# Patient Record
Sex: Female | Born: 1935 | Race: White | Hispanic: No | Marital: Single | Smoking: Former smoker
Health system: Southern US, Community
[De-identification: ages and names within clinical notes are randomized; demographics above are authoritative.]

## PROBLEM LIST (undated history)

## (undated) DIAGNOSIS — K219 Gastro-esophageal reflux disease without esophagitis: Secondary | ICD-10-CM

## (undated) DIAGNOSIS — N926 Irregular menstruation, unspecified: Secondary | ICD-10-CM

## (undated) DIAGNOSIS — Z923 Personal history of irradiation: Secondary | ICD-10-CM

## (undated) DIAGNOSIS — IMO0002 Reserved for concepts with insufficient information to code with codable children: Secondary | ICD-10-CM

## (undated) DIAGNOSIS — R51 Headache: Secondary | ICD-10-CM

## (undated) DIAGNOSIS — F419 Anxiety disorder, unspecified: Secondary | ICD-10-CM

## (undated) DIAGNOSIS — R519 Headache, unspecified: Secondary | ICD-10-CM

## (undated) DIAGNOSIS — M51369 Other intervertebral disc degeneration, lumbar region without mention of lumbar back pain or lower extremity pain: Secondary | ICD-10-CM

## (undated) DIAGNOSIS — C801 Malignant (primary) neoplasm, unspecified: Secondary | ICD-10-CM

## (undated) DIAGNOSIS — M1990 Unspecified osteoarthritis, unspecified site: Secondary | ICD-10-CM

## (undated) DIAGNOSIS — M5136 Other intervertebral disc degeneration, lumbar region: Secondary | ICD-10-CM

## (undated) DIAGNOSIS — G4730 Sleep apnea, unspecified: Secondary | ICD-10-CM

## (undated) DIAGNOSIS — I10 Essential (primary) hypertension: Secondary | ICD-10-CM

## (undated) DIAGNOSIS — N840 Polyp of corpus uteri: Secondary | ICD-10-CM

## (undated) DIAGNOSIS — I499 Cardiac arrhythmia, unspecified: Secondary | ICD-10-CM

## (undated) HISTORY — DX: Polyp of corpus uteri: N84.0

## (undated) HISTORY — PX: CATARACT EXTRACTION: SUR2

## (undated) HISTORY — DX: Personal history of irradiation: Z92.3

## (undated) HISTORY — DX: Headache: R51

## (undated) HISTORY — DX: Irregular menstruation, unspecified: N92.6

## (undated) HISTORY — DX: Headache, unspecified: R51.9

## (undated) HISTORY — DX: Malignant (primary) neoplasm, unspecified: C80.1

## (undated) HISTORY — PX: COLONOSCOPY: SHX174

## (undated) HISTORY — PX: DILATION AND CURETTAGE OF UTERUS: SHX78

## (undated) HISTORY — DX: Reserved for concepts with insufficient information to code with codable children: IMO0002

---

## 1998-09-27 ENCOUNTER — Other Ambulatory Visit: Admission: RE | Admit: 1998-09-27 | Discharge: 1998-09-27 | Payer: Self-pay | Admitting: Obstetrics and Gynecology

## 1999-11-29 ENCOUNTER — Encounter: Admission: RE | Admit: 1999-11-29 | Discharge: 1999-11-29 | Payer: Self-pay | Admitting: Internal Medicine

## 1999-11-29 ENCOUNTER — Encounter: Payer: Self-pay | Admitting: Internal Medicine

## 1999-12-06 ENCOUNTER — Encounter: Admission: RE | Admit: 1999-12-06 | Discharge: 1999-12-06 | Payer: Self-pay | Admitting: Internal Medicine

## 1999-12-06 ENCOUNTER — Encounter: Payer: Self-pay | Admitting: Internal Medicine

## 1999-12-10 ENCOUNTER — Other Ambulatory Visit: Admission: RE | Admit: 1999-12-10 | Discharge: 1999-12-10 | Payer: Self-pay | Admitting: Obstetrics and Gynecology

## 2000-11-06 ENCOUNTER — Ambulatory Visit (HOSPITAL_COMMUNITY): Admission: RE | Admit: 2000-11-06 | Discharge: 2000-11-06 | Payer: Self-pay | Admitting: *Deleted

## 2000-11-06 ENCOUNTER — Encounter (INDEPENDENT_AMBULATORY_CARE_PROVIDER_SITE_OTHER): Payer: Self-pay | Admitting: *Deleted

## 2001-02-23 ENCOUNTER — Other Ambulatory Visit: Admission: RE | Admit: 2001-02-23 | Discharge: 2001-02-23 | Payer: Self-pay | Admitting: Obstetrics and Gynecology

## 2001-02-25 ENCOUNTER — Encounter: Payer: Self-pay | Admitting: Internal Medicine

## 2001-02-25 ENCOUNTER — Encounter: Admission: RE | Admit: 2001-02-25 | Discharge: 2001-02-25 | Payer: Self-pay | Admitting: Internal Medicine

## 2002-07-26 ENCOUNTER — Encounter: Payer: Self-pay | Admitting: Internal Medicine

## 2002-07-26 ENCOUNTER — Encounter: Admission: RE | Admit: 2002-07-26 | Discharge: 2002-07-26 | Payer: Self-pay | Admitting: Internal Medicine

## 2003-03-02 ENCOUNTER — Encounter (INDEPENDENT_AMBULATORY_CARE_PROVIDER_SITE_OTHER): Payer: Self-pay | Admitting: Specialist

## 2003-03-02 ENCOUNTER — Ambulatory Visit (HOSPITAL_COMMUNITY): Admission: RE | Admit: 2003-03-02 | Discharge: 2003-03-02 | Payer: Self-pay | Admitting: *Deleted

## 2003-11-09 ENCOUNTER — Encounter: Admission: RE | Admit: 2003-11-09 | Discharge: 2003-11-09 | Payer: Self-pay | Admitting: Internal Medicine

## 2005-01-09 ENCOUNTER — Encounter: Admission: RE | Admit: 2005-01-09 | Discharge: 2005-01-09 | Payer: Self-pay | Admitting: Internal Medicine

## 2005-10-11 ENCOUNTER — Encounter (INDEPENDENT_AMBULATORY_CARE_PROVIDER_SITE_OTHER): Payer: Self-pay | Admitting: Specialist

## 2005-10-11 ENCOUNTER — Ambulatory Visit (HOSPITAL_COMMUNITY): Admission: RE | Admit: 2005-10-11 | Discharge: 2005-10-11 | Payer: Self-pay | Admitting: *Deleted

## 2006-02-05 ENCOUNTER — Encounter: Admission: RE | Admit: 2006-02-05 | Discharge: 2006-02-05 | Payer: Self-pay | Admitting: Internal Medicine

## 2006-11-13 ENCOUNTER — Ambulatory Visit (HOSPITAL_COMMUNITY): Admission: RE | Admit: 2006-11-13 | Discharge: 2006-11-13 | Payer: Self-pay | Admitting: *Deleted

## 2006-11-13 ENCOUNTER — Encounter (INDEPENDENT_AMBULATORY_CARE_PROVIDER_SITE_OTHER): Payer: Self-pay | Admitting: *Deleted

## 2006-11-26 ENCOUNTER — Inpatient Hospital Stay (HOSPITAL_COMMUNITY): Admission: EM | Admit: 2006-11-26 | Discharge: 2006-11-28 | Payer: Self-pay | Admitting: Emergency Medicine

## 2007-01-15 ENCOUNTER — Encounter: Admission: RE | Admit: 2007-01-15 | Discharge: 2007-01-15 | Payer: Self-pay | Admitting: Internal Medicine

## 2007-03-03 ENCOUNTER — Encounter: Admission: RE | Admit: 2007-03-03 | Discharge: 2007-03-03 | Payer: Self-pay | Admitting: Internal Medicine

## 2008-03-09 ENCOUNTER — Encounter: Admission: RE | Admit: 2008-03-09 | Discharge: 2008-03-09 | Payer: Self-pay | Admitting: Internal Medicine

## 2009-07-24 ENCOUNTER — Encounter: Admission: RE | Admit: 2009-07-24 | Discharge: 2009-07-24 | Payer: Self-pay | Admitting: Internal Medicine

## 2010-09-04 NOTE — Discharge Summary (Signed)
Heidi Fleming, Heidi Fleming                 ACCOUNT NO.:  [id number]   MEDICAL RECORD NO.:  [id number]          PATIENT TYPE:  INP   LOCATION:  4702                         FACILITY:  [facility]   PHYSICIAN:  Michaelyn Barter, M.D. DATE OF BIRTH:  1931/03/06   DATE OF ADMISSION:  11/26/2006  DATE OF DISCHARGE:  11/28/2006                               DISCHARGE SUMMARY   FINAL DIAGNOSIS:  1. Gastrointestinal bleed.  2. Hypokalemia.  3. Hypertension   CONSULTATIONS:  Gastroenterology with Dr. [name].   HISTORY OF PRESENT ILLNESS:  [name] is a 77 year old female who  underwent a colonoscopy on November 13, 2006, with Dr. [name].  She indicated  that on the date of this current admission at approximately 4 o'clock  p.m., she had the urge to have a bowel movement.  While defecating, she  felt a gust from her rectum.  She looked into the toilet, and the water  appeared to be filled with blood.  She came to the hospital ER for  further evaluation. While in the ER, she had at least three additional  bloody bowel movements.  She denied having any pain.  For Past Medical  History please see that dictated by Dr. Elliot Cousin   HOSPITAL COURSE:  #1.  GASTROINTESTINAL BLEED:  The source of this was questionable.  Gastroenterology was consulted. Dr. [name] saw the patient on November 27, 2006. His impression was that the patient did have a GI bleed with an  unknown etiology but it seems to have been slowing. The patient is  currently 2 weeks out from colonoscopy; however, her symptoms could  still be related to that. He indicated that observation should take  place for now. Over the following 24 hours, the patient has indicated  that her rectal bleeding has decreased significantly.  She has remained  hemodynamically stable throughout her hospitalization, and she has  denied having any abdominal pain throughout this hospitalization.   #2.  HYPOKALEMIA:  The patient was noted to have had a potassium level  of 3.3 at  the time of admission.  This was supplemented.   #3.  HISTORY OF HYPERTENSION:  This remained stable.   On the date of discharge, the patient indicated that she felt good.  She  denied having any abdominal pain, and she stated that the rectal  bleeding had decreased.  Vital Signs:  Her temperature was 97.6, heart  rate 71, respirations 18, blood pressure 129/68, O2 saturation 97% on 2  liters.  She looks relatively comfortable.  Her sodium level was 137,  potassium 3.7, chloride 104, CO2 27, BUN 10, creatinine 0.95, glucose  79, calcium 8.5. White blood cell count of 8.6, hemoglobin 11.5,  hematocrit 33.4, platelets 235.  The patient indicated that she wanted  to be discharged home today.  Therefore, the decision was made to  discharge the patient from the hospital.  The patient will be discharged  home on her previous home medications.  She indicated that she has an  appointment to see Dr. [name] within the next couple of weeks already  scheduled.  She will be instructed to keep that appointment and to call  either Dr. [name] or to return to the hospital if further bleeding from her  rectum continues      Michaelyn Barter, M.D.  Electronically Signed     OR/MEDQ  D:  11/28/2006  T:  11/28/2006  Job:  [id number]

## 2010-09-04 NOTE — H&P (Signed)
Heidi Fleming, Heidi Fleming                 ACCOUNT NO.:  [id number]   MEDICAL RECORD NO.:  [id number]          PATIENT TYPE:  INP   LOCATION:  1827                         FACILITY:  [facility]   PHYSICIAN:  Elliot Cousin, M.D.    DATE OF BIRTH:  08-16-1930   DATE OF ADMISSION:  11/26/2006  DATE OF DISCHARGE:                              HISTORY & PHYSICAL   PRIMARY CARE PHYSICIAN:  [name], M.D.   PRIMARY GASTROENTEROLOGIST:  [name], M.D.   CHIEF COMPLAINT:  Rectal bleeding.   HISTORY OF PRESENT ILLNESS:  The patient is a 77 year old woman with a  past medical history significant for colon polyps and hypertension, who  presents to the emergency department with a chief complaint of rectal  bleeding.  The patient recently underwent a colonoscopy on November 13, 2006  by Dr. [name].  She was found to have 2 polyps in the ascending colon which  were biopsied.  The pathology report revealed hyperplastic polyps.  Since that time, the patient has had no abdominal pain or rectal  bleeding until this afternoon.  At approximately 4 to 4:30 p.m., the  patient felt as if she needed to have a bowel movement.  She proceeded  to the bathroom and felt a gush of what was thought to be stool come  from her rectum.  When she looked into the toilet, she saw that the bowl  was filled with blood.  She became alarmed and decided to come to the  emergency department.  When she arrived to the emergency department at  approximately 5:30 to 6 o'clock p.m., she had another very large bloody  bowel movement.  Throughout the stay in the emergency department, she  had an additional three more medium sized bloody bowel movements.  Most  of the bowel movements have been maroon in color.  There has been very  little stool with each bowel movement.  She did have a mild episode of  cramping with one bowel movement, otherwise she has had no abdominal  pain.  She has had no nausea, vomiting or hematemesis.  She does  complain  of generalized weakness.  The patient denies NSAID use.  She  does not drink alcohol.   During the evaluation in the emergency department, the patient is noted  to be hypertensive with a blood pressure of 161/83.  She was initially  tachycardic on arrival to the emergency department with a heart rate of  119 beats per minute.  Her hemoglobin is currently 14.1.  The patient  will be admitted for further evaluation and management.   PAST MEDICAL HISTORY:  1. History of multiple colon polyps.  2. Colonoscopy with biopsy October 14, 2006 per Dr. [name].  The results      revealed 2 polyps of the ascending colon, hyperplastic.  3. Colonoscopy in June 2007 with biopsy and status post polypectomy.      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  4. Hypertension.  5. Gastroesophageal reflux disease.  6. Hiatal hernia per EGD  in July 2002.  7. Chronic insomnia.  8. Status post bilateral cataract surgery in the past.   MEDICATIONS:  1. Alprazolam 0.5 mg q.h.s.  2. Multivitamin once daily.  3. Quinapril 10 mg daily.  4. Hydrochlorothiazide 25 mg one-half tablet daily.  5. Omeprazole 20 mg daily.   ALLERGIES:  There are no known drug allergies, although she is  intolerant to [facility].   SOCIAL HISTORY:  The patient is single.  She has no children.  She is  retired.  She denies tobacco, alcohol and illicit drug use.   FAMILY HISTORY:  The patient's mother died of a stroke at 61 years of  age.  Her father died of aspiration pneumonia at 44 years of age.  He  also had a history of diabetes mellitus.   REVIEW OF SYSTEMS:  Otherwise negative.   PHYSICAL EXAMINATION:  VITAL SIGNS:  Temperature is 97.3, blood pressure  161/83, pulse 89, respiratory rate 23, oxygen saturation 99% on room  air.  GENERAL:  The patient is a pleasant obese, 77 year old Caucasian woman  who is currently lying in bed in no acute distress.  HEENT:  Head is normocephalic,  atraumatic.  Pupils are equal, round,  reactive to light.  Extraocular movements are intact.  Conjunctivae are  clear.  Sclerae are white.  Nasal mucosa is mildly dry.  No sinus  tenderness.  Oropharynx reveals fair dentition.  Mucous membranes are  moist.  No posterior exudates or erythema.  NECK:  Supple.  No adenopathy, no thyromegaly, no bruit, no JVD.  LUNGS:  Clear to auscultation bilaterally.  HEART:  S1 and S2 with borderline tachycardia.  No rubs or murmurs.  ABDOMEN:  Obese, positive bowel sounds, soft, nontender, nondistended.  No hepatosplenomegaly.  No mass is palpated.  RECTAL/GU:  Deferred.  EXTREMITIES:  Pedal pulses are palpable bilaterally.  No pretibial edema  and no pedal edema.  NEUROLOGIC:  The patient is alert, oriented times 3.  Cranial nerves II  through XII are intact.  Strength is 5/5 throughout.  Sensation is  intact.   ADMISSION LABORATORIES:  EKG is pending.  White blood count 8.6,  hemoglobin 14.1, platelets 235.  PT 13.2, INR 1, PTT 32.  Sodium 134,  potassium 3.3, chloride 98, CO2 26, glucose 116, BUN 13, creatinine  0.96, calcium 8.8, total protein 6.7, albumin 3.5, AST 22, ALT 22.   ASSESSMENT:  1. Hematochezia / lower gastrointestinal bleed.  The patient recently      underwent a colonoscopy which revealed 2 polyps.  It is unclear      whether or not the gastrointestinal bleeding is a consequence of      the recent polypectomy.  There was apparently no other lesions per      the colonoscopy report by Dr. [name].  Her hemoglobin is currently      stable.  Her blood pressure is actually moderately hypertensive.      She does have borderline tachycardia, however.  2. Hypokalemia.  The patient's serum potassium is 3.3.  More than      likely, the hypokalemia is secondary to hydrochlorothiazide      therapy.  3. Hypertension.   PLAN:  1. The patient will be admitted for further evaluation and management.  2. Will type and screen 2 units of packed red  blood cells and hold.  3. Gentle IV fluids.  4. Sips of clear liquids for now.  5. Will monitor her hemoglobin and hematocrit every 4 hours times  24      hours and daily thereafter.  6. IV Protonix.  7. Blood pressure control.  8. Replete potassium chloride and check a magnesium level to rule out      deficiency.  9. Will consult Dr. [name].      Elliot Cousin, M.D.  Electronically Signed     DF/MEDQ  D:  11/26/2006  T:  11/26/2006  Job:  [id number]   cc:   [name], MD  [name], M.D.

## 2010-09-04 NOTE — Op Note (Signed)
Heidi Fleming, Heidi Fleming                 ACCOUNT NO.:  [id number]   MEDICAL RECORD NO.:  [id number]          PATIENT TYPE:  AMB   LOCATION:  ENDO                         FACILITY:  [facility]   PHYSICIAN:  [name], M.D.    DATE OF BIRTH:  November 25, 1930   DATE OF PROCEDURE:  11/13/2006  DATE OF DISCHARGE:                               OPERATIVE REPORT   PROCEDURE:  Colonoscopy with biopsy.   INDICATIONS:  Colon polyps.   ANESTHESIA:  Demerol 100 mg, Versed 7 mg.   PROCEDURE:  With the patient mildly sedated in the left lateral  decubitus position the Pentax videoscopic colonoscope was inserted in  the rectum and passed under direct vision to the cecum identified by  ileocecal valve and appendiceal orifice both which were photographed.  From this point the colonoscope was slowly withdrawn taking  circumferential views of colonic mucosa stopping in the ascending colon  where two polyps were seen, photographed and removed both using hot  biopsy forceps technique setting of 20/150 blended current.  We then  withdrew the colonoscope taking circumferential views of remaining  colonic mucosa stopping only in the rectum which appeared normal on  direct and retroflexed view.  The endoscope was straightened and  withdrawn.  The patient's vital signs, pulse oximeter remained stable.  The patient tolerated procedure well without apparent complications.   FINDINGS:  Two polyps of the ascending colon.  Await biopsy report.  The  patient will call me for results and follow-up with me as an outpatient  as needed.           ______________________________  [name], M.D.     GMO/MEDQ  D:  11/13/2006  T:  11/13/2006  Job:  [id number]

## 2010-09-07 NOTE — Procedures (Signed)
[facility]  Patient:    Heidi Fleming, Heidi Fleming                        MRN: [id number] Proc. Date: 11/06/00 Adm. Date:  [id number] Attending:  Sabino Gasser                           Procedure Report  PROCEDURE:  Upper endoscopy.  INDICATION FOR PROCEDURE:  Significant reflux symptoms.  ANESTHESIA:  Demerol 50, Versed 5 mg.  DESCRIPTION OF PROCEDURE:  With the patient mildly sedated in the left lateral decubitus position, the Olympus videoscopic endoscope was inserted in the mouth and passed under direct vision through the esophagus which appeared grossly normal. We entered into the stomach through a small hiatal hernia. The fundus, body, antrum, duodenal bulb and second portion of the duodenum all appeared normal. From this point, the endoscope was slowly withdrawn taking circumferential views of the entire duodenal mucosa until the endoscope was then pulled back into the stomach, placed in retroflexion to view the stomach from below and a hiatal hernia was once again seen. The endoscope was then straightened and withdrawn taking circumferential views of the remaining gastric and esophageal mucosa. No evidence of Barretts was seen. The patients vital signs and pulse oximeter remained stable. The patient tolerated the procedure well without apparent complications.  FINDINGS:  Small hiatal hernia otherwise unremarkable examination.  PLAN:  Proceed to colonoscopy as planned. DD:  11/06/00 TD:  11/06/00 Job: [id number]

## 2010-09-07 NOTE — Procedures (Signed)
[facility]  Patient:    Heidi Fleming, Heidi Fleming                        MRN: [id number] Proc. Date: 11/06/00 Adm. Date:  [id number] Attending:  Sabino Gasser                           Procedure Report  PROCEDURE:  Colonoscopy.  INDICATION FOR PROCEDURE:  Colon polyps seen on flexible sigmoidoscopy.  ANESTHESIA:  Demerol 50, Versed 5 mg additional.  DESCRIPTION OF PROCEDURE:  With the patient mildly sedated in the left lateral decubitus position, the Olympus videoscopic colonoscope was inserted in the rectum and passed under direct vision to the cecum identified by the ileocecal valve and appendiceal orifice both of which were photographed. One fold removed from the ileocecal valve was an approximately 1 cm in length polyp. It was folded over that fold. This was photographed and first biopsies were taken and then subsequent ERBE argon photocoagulator probe was attached and advanced through the endoscope and this area was eradicated. There were numerous other small polyps in this area of the cecum and they were removed either by hot biopsy forceps technique or with eradication by ERBE argon photocoagulator. Once this was accomplished, the endoscope was then slowly withdrawn taking circumferential views of the entire colonic mucosa stopping next in the hepatic flexure area where another polyp was seen. It too was photographed and it was removed using hot biopsy forceps technique. There was some residual tissue that was also eradicated using argon photocoagulator. The endoscope was then withdrawn all the way to the descending/sigmoid colon where two polyps adjacent to each other were seen. These may have been the index polyp and they were both removed using snare cautery technique on a setting of 20/20 blended current which is what we used with the hot biopsy forceps procedures described above. This tissue was retrieved, the endoscope was withdrawn all the way to the  rectum which appeared normal on direct and retroflexed view. The endoscope was then straightened and withdrawn. The patients vital signs and pulse oximeter remained stable. The patient tolerated the procedure well without apparent complications.  FINDINGS:  Multiple polyps of cecum, polyps to hepatic flexure, polyps of the descending/sigmoid colon area.  PLAN:  Await pathology report. The patient will call me for results and follow-up with me as an outpatient. Will avoid aspirin and place the patient on a low residue diet for the next two weeks. DD:  11/06/00 TD:  11/06/00 Job: [id number]

## 2010-09-07 NOTE — Op Note (Signed)
   NAME:  Heidi Fleming, Heidi Fleming                           ACCOUNT NO.:  [id number]   MEDICAL RECORD NO.:  [id number]                   PATIENT TYPE:  AMB   LOCATION:  ENDO                                 FACILITY:  [facility]   PHYSICIAN:  [name], M.D.                 DATE OF BIRTH:  1930/08/28   DATE OF PROCEDURE:  DATE OF DISCHARGE:                                 OPERATIVE REPORT   PROCEDURE:  Colonoscopy with polypectomy and biopsy.   INDICATIONS FOR PROCEDURE:  Colon polyps.   ANESTHESIA:  Demerol 80, Versed 8 mg.   DESCRIPTION OF PROCEDURE:  With the patient mildly sedated in the left  lateral decubitus position, the Olympus videoscopic colonoscope was inserted  in the rectum and passed under direct vision to the cecum identified by the  ileocecal valve and appendiceal orifice both of which were photographed.  From this point, the colonoscope was slowly withdrawn taking circumferential  views of the colonic mucosa as we withdrew all the way to the rectum. We  stopped only first in the ascending colon just a few folds removed from the  ileocecal valve where a polyp was seen, photographed and removed using hot  biopsy forceps technique. We stopped next in the descending colon where  another polyp was seen, photographed and removed using hot forceps technique  with again the same setting. We stopped to photograph diverticula seen in  the sigmoid colon and then also in the sigmoid colon a polyp was seen,  photographed and removed using snare cautery technique. All of the tissue  was retrieved for pathology. The endoscope was pulled back to the rectum  which appeared normal on direct and showed large hemorrhoids on retroflexed  view and pulled through the anal canal. The patient's vital signs and pulse  oximeter remained stable. The patient tolerated the procedure well without  apparent complications.   FINDINGS:  Polyps of ascending, descending and sigmoid colon, occasional  diverticulum seen in the sigmoid colon and large internal hemorrhoids.   PLAN:  Await biopsy report. The patient will call me for results and  followup with me as an outpatient.                                               [name], M.D.    GMO/MEDQ  D:  03/02/2003  T:  03/02/2003  Job:  [id number]

## 2010-09-07 NOTE — Op Note (Signed)
NAMEKENNEDY, Heidi Fleming                 ACCOUNT NO.:  [id number]   MEDICAL RECORD NO.:  [id number]          PATIENT TYPE:  AMB   LOCATION:  ENDO                         FACILITY:  [facility]   PHYSICIAN:  [name], M.D.    DATE OF BIRTH:  Aug 19, 1930   DATE OF PROCEDURE:  10/11/2005  DATE OF DISCHARGE:                                 OPERATIVE REPORT   PROCEDURE:  Colonoscopy with polypectomy and biopsy.   INDICATIONS:  Colon polyps.   ANESTHESIA:  Demerol 100 and Versed 10 mg.   PROCEDURE:  With the patient mildly sedated in the left lateral decubitus  position, the Olympus videoscopic colonoscope was inserted in the rectum and  passed under direct vision.  With pressure applied, we reached the cecum,  identified by the ileocecal valve and appendiceal orifice both of which were  photographed.  From this point, the colonoscope was slowly withdrawn taking  circumferential views of colonic mucosa stopping in the ascending colon near  the hepatic flexure where we encountered three polyps.  First was bean  shaped on a stalk which we removed using snare cautery technique setting of  25/200 blended current.  There was a polyp adjacent to it on the wall of the  colon which we removed using hot biopsy forceps technique and eradicated the  rest of the wall using the hot biopsy forceps tip, again with a setting of  25/200 blended current.  There was a third polyp that we removed again using  hot biopsy forceps technique and this was photographed and removed using the  same setting.  All of the tissue was retrieved for pathology. The original  polyp had to be removed by suctioning it into a [employer] and we had to  remove the colonoscope was then reinsert back to this level.  The endoscope  was then withdrawn taking circumferential views of the remaining colonic  mucosa stopping in the descending colon where a polyp was seen and removed  using snare cautery technique with the same setting and a  third polyp was  seen at 25 cm from the anal verge.  It was photographed and it too was  removed using snare cautery technique with the same setting.  All of these  polyps were retrieved for pathology.  The endoscope was withdrawn then all  the way to the rectum, taking circumferential views of the remaining colonic  mucosa stopping in the rectum which appeared normal on direct and  retroflexed view.  The endoscope was straightened and withdrawn.  The  patient's vital signs and pulse oximeter remained stable.  The patient  tolerated procedure well without apparent complication.   FINDINGS:  Multiple polyps in the ascending colon, descending colon and at  25 cm from anal verge.  Await biopsy report.  The patient will call me for  results and follow-up with me as an outpatient.           ______________________________  [name], M.D.     GMO/MEDQ  D:  10/11/2005  T:  10/11/2005  Job:  [id number]

## 2011-02-04 LAB — BASIC METABOLIC PANEL
BUN: 11
CO2: 27
CO2: 28
Calcium: 8.2 — ABNORMAL LOW
Calcium: 8.5
Chloride: 103
Chloride: 104
Creatinine, Ser: 0.87
Creatinine, Ser: 0.95
GFR calc Af Amer: >60
GFR calc Af Amer: >60
GFR calc non Af Amer: 58 — ABNORMAL LOW
GFR calc non Af Amer: >60
Glucose, Bld: 110 — ABNORMAL HIGH
Glucose, Bld: 79
Potassium: 3.7
Potassium: 3.9
Sodium: 137
Sodium: 139

## 2011-02-04 LAB — CROSSMATCH
ABO/RH(D): O POS
Antibody Screen: NEGATIVE

## 2011-02-04 LAB — COMPREHENSIVE METABOLIC PANEL
ALT: 22
AST: 22
Albumin: 3.5
Alkaline Phosphatase: 82
BUN: 13
CO2: 26
Calcium: 8.8
Chloride: 98
Creatinine, Ser: 0.96
GFR calc Af Amer: >60
GFR calc non Af Amer: 57 — ABNORMAL LOW
Glucose, Bld: 116 — ABNORMAL HIGH
Potassium: 3.3 — ABNORMAL LOW
Sodium: 134 — ABNORMAL LOW
Total Bilirubin: 0.9
Total Protein: 6.7

## 2011-02-04 LAB — CBC
HCT: 33.4 — ABNORMAL LOW
HCT: 40.2
Hemoglobin: 11.5 — ABNORMAL LOW
MCHC: 34.6
MCV: 87.8
Platelets: 235
RBC: 3.8 — ABNORMAL LOW
RBC: 4.67
RDW: 14.4 — ABNORMAL HIGH
WBC: 8.6

## 2011-02-04 LAB — HEMOGLOBIN AND HEMATOCRIT, BLOOD
HCT: 30.9 — ABNORMAL LOW
HCT: 31 — ABNORMAL LOW
HCT: 34.5 — ABNORMAL LOW
HCT: 34.6 — ABNORMAL LOW
HCT: 35.8 — ABNORMAL LOW
HCT: 36.2
Hemoglobin: 10.5 — ABNORMAL LOW
Hemoglobin: 10.6 — ABNORMAL LOW
Hemoglobin: 11.7 — ABNORMAL LOW
Hemoglobin: 11.8 — ABNORMAL LOW
Hemoglobin: 12.5
Hemoglobin: 12.6

## 2011-02-04 LAB — DIFFERENTIAL
Basophils Relative: 0
Eosinophils Absolute: 0.2
Eosinophils Relative: 2
Lymphocytes Relative: 22
Lymphs Abs: 1.9
Monocytes Relative: 8

## 2011-02-04 LAB — APTT: aPTT: 32

## 2011-02-04 LAB — ABO/RH: ABO/RH(D): O POS

## 2011-02-04 LAB — PROTIME-INR: Prothrombin Time: 13.2

## 2011-08-28 ENCOUNTER — Ambulatory Visit (INDEPENDENT_AMBULATORY_CARE_PROVIDER_SITE_OTHER): Payer: Medicare Other | Admitting: Ophthalmology

## 2011-08-28 DIAGNOSIS — I10 Essential (primary) hypertension: Secondary | ICD-10-CM

## 2011-08-28 DIAGNOSIS — H43819 Vitreous degeneration, unspecified eye: Secondary | ICD-10-CM

## 2011-08-28 DIAGNOSIS — H35379 Puckering of macula, unspecified eye: Secondary | ICD-10-CM

## 2011-08-28 DIAGNOSIS — H33309 Unspecified retinal break, unspecified eye: Secondary | ICD-10-CM

## 2011-08-28 DIAGNOSIS — H2700 Aphakia, unspecified eye: Secondary | ICD-10-CM

## 2011-08-28 DIAGNOSIS — H35039 Hypertensive retinopathy, unspecified eye: Secondary | ICD-10-CM

## 2012-02-15 ENCOUNTER — Encounter (HOSPITAL_COMMUNITY): Payer: Self-pay | Admitting: Pharmacist

## 2012-02-20 ENCOUNTER — Encounter (HOSPITAL_COMMUNITY): Payer: Self-pay | Admitting: *Deleted

## 2012-02-20 ENCOUNTER — Other Ambulatory Visit: Payer: Self-pay | Admitting: Obstetrics & Gynecology

## 2012-02-25 ENCOUNTER — Encounter (HOSPITAL_COMMUNITY)
Admission: RE | Admit: 2012-02-25 | Discharge: 2012-02-25 | Disposition: A | Discharge status: Home / Self Care | Payer: Medicare Other | Source: Ambulatory Visit | Attending: Obstetrics & Gynecology | Admitting: Obstetrics & Gynecology

## 2012-02-25 ENCOUNTER — Encounter (HOSPITAL_COMMUNITY): Payer: Self-pay

## 2012-02-25 LAB — COMPREHENSIVE METABOLIC PANEL
BUN: 15 mg/dL (ref 6–23)
CO2: 31 mEq/L (ref 19–32)
Calcium: 9.2 mg/dL (ref 8.4–10.5)
Creatinine, Ser: 0.79 mg/dL (ref 0.50–1.10)
GFR calc Af Amer: 89 mL/min — ABNORMAL LOW (ref >90)
GFR calc non Af Amer: 77 mL/min — ABNORMAL LOW (ref >90)
Glucose, Bld: 79 mg/dL (ref 70–99)
Total Protein: 7 g/dL (ref 6.0–8.3)

## 2012-02-25 LAB — CBC
HCT: 40.2 % (ref 36.0–46.0)
Hemoglobin: 13.5 g/dL (ref 12.0–15.0)
MCH: 27.7 pg (ref 26.0–34.0)
MCHC: 33.6 g/dL (ref 30.0–36.0)
MCV: 82.4 fL (ref 78.0–100.0)
RBC: 4.88 MIL/uL (ref 3.87–5.11)

## 2012-02-25 NOTE — Pre-Procedure Instructions (Signed)
Cardiac information from Dr Jacinto Halim reviewed by Dr. Brayton Caves. No orders given. Note of clearance requested per his request.

## 2012-02-25 NOTE — Patient Instructions (Addendum)
20 [facility] NOYES  02/25/2012   Your procedure is scheduled on:  02/28/12  Enter through the Main Entrance of [facility] at 1015 AM.  Pick up the phone at the desk and dial [phone].   Call this number if you have problems the morning of surgery: [phone]   Remember:   Do not eat food:After Midnight.  Do not drink clear liquids: After Midnight.  Take these medicines the morning of surgery with A SIP OF WATER: Blood pressure medication and Prilosec, hold fluid pill   Do not wear jewelry, make-up or nail polish.  Do not wear lotions, powders, or perfumes. You may wear deodorant.  Do not shave 48 hours prior to surgery.  Do not bring valuables to the hospital.  Contacts, dentures or bridgework may not be worn into surgery.  Leave suitcase in the car. After surgery it may be brought to your room.  For patients admitted to the hospital, checkout time is 11:00 AM the day of discharge.   Patients discharged the day of surgery will not be allowed to drive home.  Name and phone number of your driver: Niece   Harrington Challenger  Special Instructions: Shower using CHG 2 nights before surgery and the night before surgery.  If you shower the day of surgery use CHG.  Use special wash - you have one bottle of CHG for all showers.  You should use approximately 1/3 of the bottle for each shower.   Please read over the following fact sheets that you were given: Surgical Site Infection Prevention

## 2012-02-28 ENCOUNTER — Encounter (HOSPITAL_COMMUNITY): Payer: Self-pay | Admitting: Anesthesiology

## 2012-02-28 ENCOUNTER — Encounter (HOSPITAL_COMMUNITY): Payer: Self-pay

## 2012-02-28 ENCOUNTER — Encounter (HOSPITAL_COMMUNITY)
Admission: RE | Disposition: A | Discharge status: Home / Self Care | Payer: Self-pay | Source: Ambulatory Visit | Attending: Obstetrics & Gynecology

## 2012-02-28 ENCOUNTER — Ambulatory Visit (HOSPITAL_COMMUNITY): Payer: Medicare Other | Admitting: Anesthesiology

## 2012-02-28 ENCOUNTER — Ambulatory Visit (HOSPITAL_COMMUNITY)
Admission: RE | Admit: 2012-02-28 | Discharge: 2012-02-28 | Disposition: A | Discharge status: Home / Self Care | Payer: Medicare Other | Source: Ambulatory Visit | Attending: Obstetrics & Gynecology | Admitting: Obstetrics & Gynecology

## 2012-02-28 DIAGNOSIS — Z01812 Encounter for preprocedural laboratory examination: Secondary | ICD-10-CM | POA: Insufficient documentation

## 2012-02-28 DIAGNOSIS — N950 Postmenopausal bleeding: Secondary | ICD-10-CM | POA: Insufficient documentation

## 2012-02-28 DIAGNOSIS — N840 Polyp of corpus uteri: Secondary | ICD-10-CM | POA: Insufficient documentation

## 2012-02-28 DIAGNOSIS — D250 Submucous leiomyoma of uterus: Secondary | ICD-10-CM | POA: Insufficient documentation

## 2012-02-28 DIAGNOSIS — Z01818 Encounter for other preprocedural examination: Secondary | ICD-10-CM | POA: Insufficient documentation

## 2012-02-28 DIAGNOSIS — IMO0002 Reserved for concepts with insufficient information to code with codable children: Secondary | ICD-10-CM

## 2012-02-28 DIAGNOSIS — R9389 Abnormal findings on diagnostic imaging of other specified body structures: Secondary | ICD-10-CM | POA: Insufficient documentation

## 2012-02-28 HISTORY — DX: Reserved for concepts with insufficient information to code with codable children: IMO0002

## 2012-02-28 HISTORY — DX: Gastro-esophageal reflux disease without esophagitis: K21.9

## 2012-02-28 HISTORY — DX: Sleep apnea, unspecified: G47.30

## 2012-02-28 HISTORY — PX: DILATATION & CURRETTAGE/HYSTEROSCOPY WITH RESECTOCOPE: SHX5572

## 2012-02-28 HISTORY — DX: Essential (primary) hypertension: I10

## 2012-02-28 SURGERY — DILATATION & CURETTAGE/HYSTEROSCOPY WITH RESECTOCOPE
Anesthesia: General | Site: Vagina | Wound class: Clean Contaminated

## 2012-02-28 MED ORDER — CEFAZOLIN SODIUM-DEXTROSE 2-3 GM-% IV SOLR
2.0000 g | INTRAVENOUS | Status: AC
  Administered 2012-02-28: 2 g via INTRAVENOUS

## 2012-02-28 MED ORDER — FENTANYL CITRATE 0.05 MG/ML IJ SOLN
INTRAMUSCULAR | Status: AC
Start: 2012-02-28 — End: 2012-02-28
  Administered 2012-02-28: 25 ug via INTRAVENOUS
  Filled 2012-02-28: qty 2

## 2012-02-28 MED ORDER — GLYCINE 1.5 % IR SOLN
Status: DC | PRN
  Administered 2012-02-28 (×4): 3000 mL

## 2012-02-28 MED ORDER — PROMETHAZINE HCL 25 MG/ML IJ SOLN: 6.2500 mg | INTRAMUSCULAR | Status: DC | PRN

## 2012-02-28 MED ORDER — LIDOCAINE HCL (CARDIAC) 20 MG/ML IV SOLN
INTRAVENOUS | Status: DC | PRN
  Administered 2012-02-28: 50 mg via INTRAVENOUS

## 2012-02-28 MED ORDER — PHENYLEPHRINE 40 MCG/ML (10ML) SYRINGE FOR IV PUSH (FOR BLOOD PRESSURE SUPPORT)
PREFILLED_SYRINGE | INTRAVENOUS | Status: AC
  Filled 2012-02-28: qty 5

## 2012-02-28 MED ORDER — LACTATED RINGERS IV SOLN
INTRAVENOUS | Status: DC
  Administered 2012-02-28: 13:00:00 via INTRAVENOUS
  Administered 2012-02-28: 50 mL/h via INTRAVENOUS

## 2012-02-28 MED ORDER — HYDROCODONE-ACETAMINOPHEN 5-500 MG PO TABS: 1.0000 | ORAL_TABLET | Freq: Four times a day (QID) | ORAL | Status: DC | PRN

## 2012-02-28 MED ORDER — PROPOFOL 10 MG/ML IV EMUL
INTRAVENOUS | Status: AC
  Filled 2012-02-28: qty 20

## 2012-02-28 MED ORDER — MEPERIDINE HCL 25 MG/ML IJ SOLN: 6.2500 mg | INTRAMUSCULAR | Status: DC | PRN

## 2012-02-28 MED ORDER — PHENYLEPHRINE HCL 10 MG/ML IJ SOLN
INTRAMUSCULAR | Status: DC | PRN
  Administered 2012-02-28: 80 ug via INTRAVENOUS
  Administered 2012-02-28 (×2): 40 ug via INTRAVENOUS
  Administered 2012-02-28 (×2): 80 ug via INTRAVENOUS

## 2012-02-28 MED ORDER — PROPOFOL 10 MG/ML IV EMUL
INTRAVENOUS | Status: DC | PRN
  Administered 2012-02-28 (×2): 25 mg via INTRAVENOUS
  Administered 2012-02-28: 150 mg via INTRAVENOUS

## 2012-02-28 MED ORDER — CHLOROPROCAINE HCL 1 % IJ SOLN
INTRAMUSCULAR | Status: DC | PRN
  Administered 2012-02-28: 20 mL

## 2012-02-28 MED ORDER — FENTANYL CITRATE 0.05 MG/ML IJ SOLN
INTRAMUSCULAR | Status: AC
  Filled 2012-02-28: qty 2

## 2012-02-28 MED ORDER — ONDANSETRON HCL 4 MG/2ML IJ SOLN
INTRAMUSCULAR | Status: DC | PRN
  Administered 2012-02-28: 4 mg via INTRAVENOUS

## 2012-02-28 MED ORDER — LIDOCAINE HCL (CARDIAC) 20 MG/ML IV SOLN
INTRAVENOUS | Status: AC
  Filled 2012-02-28: qty 5

## 2012-02-28 MED ORDER — CHLOROPROCAINE HCL 1 % IJ SOLN
INTRAMUSCULAR | Status: AC
  Filled 2012-02-28: qty 30

## 2012-02-28 MED ORDER — ONDANSETRON HCL 4 MG/2ML IJ SOLN
INTRAMUSCULAR | Status: AC
  Filled 2012-02-28: qty 2

## 2012-02-28 MED ORDER — KETOROLAC TROMETHAMINE 30 MG/ML IJ SOLN: 15.0000 mg | Freq: Once | INTRAMUSCULAR | Status: DC | PRN

## 2012-02-28 MED ORDER — FENTANYL CITRATE 0.05 MG/ML IJ SOLN
25.0000 ug | INTRAMUSCULAR | Status: DC | PRN
  Administered 2012-02-28: 25 ug via INTRAVENOUS

## 2012-02-28 MED ORDER — CEFAZOLIN SODIUM-DEXTROSE 2-3 GM-% IV SOLR
INTRAVENOUS | Status: AC
  Filled 2012-02-28: qty 50

## 2012-02-28 MED ORDER — FENTANYL CITRATE 0.05 MG/ML IJ SOLN
INTRAMUSCULAR | Status: DC | PRN
  Administered 2012-02-28: 50 ug via INTRAVENOUS
  Administered 2012-02-28 (×2): 25 ug via INTRAVENOUS

## 2012-02-28 SURGICAL SUPPLY — 19 items
ABLATOR ENDOMETRIAL BIPOLAR (ABLATOR) IMPLANT
CANISTER SUCTION 2500CC (MISCELLANEOUS) ×3 IMPLANT
CATH ROBINSON RED A/P 16FR (CATHETERS) ×2 IMPLANT
CLOTH BEACON ORANGE TIMEOUT ST (SAFETY) ×2 IMPLANT
CONTAINER PREFILL 10% NBF 60ML (FORM) ×4 IMPLANT
DRESSING TELFA 8X3 (GAUZE/BANDAGES/DRESSINGS) ×2 IMPLANT
ELECT REM PT RETURN 9FT ADLT (ELECTROSURGICAL) ×2
ELECTRODE REM PT RTRN 9FT ADLT (ELECTROSURGICAL) ×1 IMPLANT
GLOVE BIO SURGEON STRL SZ 6.5 (GLOVE) ×4 IMPLANT
GLOVE BIOGEL PI IND STRL 7.0 (GLOVE) ×1 IMPLANT
GLOVE BIOGEL PI INDICATOR 7.0 (GLOVE) ×1
GOWN STRL REIN XL XLG (GOWN DISPOSABLE) ×6 IMPLANT
LOOP ANGLED CUTTING 22FR (CUTTING LOOP) IMPLANT
PACK HYSTEROSCOPY LF (CUSTOM PROCEDURE TRAY) ×2 IMPLANT
PAD OB MATERNITY 4.3X12.25 (PERSONAL CARE ITEMS) ×2 IMPLANT
SUT VIC AB 4-0 SH 27 (SUTURE) ×2
SUT VIC AB 4-0 SH 27XANBCTRL (SUTURE) IMPLANT
TOWEL OR 17X24 6PK STRL BLUE (TOWEL DISPOSABLE) ×4 IMPLANT
WATER STERILE IRR 1000ML POUR (IV SOLUTION) ×2 IMPLANT

## 2012-02-28 NOTE — Anesthesia Postprocedure Evaluation (Signed)
Anesthesia Post Note  Patient: Heidi Fleming  Procedure(s) Performed: Procedure(s) (LRB): DILATATION & CURETTAGE/HYSTEROSCOPY WITH RESECTOCOPE (N/A)  Anesthesia type: General  Patient location: PACU  Post pain: Pain level controlled  Post assessment: Post-op Vital signs reviewed  Last Vitals:  Filed Vitals:   02/28/12 1445  BP: 167/84  Pulse: 77  Temp:   Resp: 15    Post vital signs: Reviewed  Level of consciousness: sedated  Complications: No apparent anesthesia complications

## 2012-02-28 NOTE — Anesthesia Preprocedure Evaluation (Signed)
Anesthesia Evaluation  Patient identified by MRN, date of birth, ID band Patient awake    Reviewed: Allergy & Precautions, H&P , Patient's Chart, lab work & pertinent test results, reviewed documented beta blocker date and time   History of Anesthesia Complications Negative for: history of anesthetic complications  Airway Mallampati: III TM Distance: >3 FB Neck ROM: full    Dental No notable dental hx.    Pulmonary neg pulmonary ROS, sleep apnea ,  breath sounds clear to auscultation  Pulmonary exam normal       Cardiovascular Exercise Tolerance: Good hypertension, negative cardio ROS  Rhythm:regular Rate:Normal     Neuro/Psych negative neurological ROS  negative psych ROS   GI/Hepatic negative GI ROS, Neg liver ROS, GERD-  Controlled,  Endo/Other  negative endocrine ROS  Renal/GU negative Renal ROS     Musculoskeletal   Abdominal   Peds  Hematology negative hematology ROS (+)   Anesthesia Other Findings Hypertension     Sleep apnea        GERD (gastroesophageal reflux disease)    Reproductive/Obstetrics negative OB ROS                           Anesthesia Physical Anesthesia Plan  ASA: II  Anesthesia Plan: General LMA   Post-op Pain Management:    Induction:   Airway Management Planned:   Additional Equipment:   Intra-op Plan:   Post-operative Plan:   Informed Consent: I have reviewed the patients History and Physical, chart, labs and discussed the procedure including the risks, benefits and alternatives for the proposed anesthesia with the patient or authorized representative who has indicated his/her understanding and acceptance.   Dental Advisory Given  Plan Discussed with: CRNA, Surgeon and Anesthesiologist  Anesthesia Plan Comments:         Anesthesia Quick Evaluation

## 2012-02-28 NOTE — H&P (Signed)
Heidi Fleming is an 78 y.o. female G0  RP:  HSC resection, D+C for PMB, thick endometrium 22+ mm.  Pertinent Gynecological History: Menses: post-menopausal bleeding Blood transfusions: none Sexually transmitted diseases: no past history Previous GYN Procedures: D+C  Last mammogram: normal  Last pap: normal OB History: G0   Menstrual History: No LMP recorded.    Past Medical History  Diagnosis Date  . Hypertension   . Sleep apnea   . GERD (gastroesophageal reflux disease)     Past Surgical History  Procedure Date  . Dilation and curettage of uterus   . Cataract extraction     bilateral  age 55yo    History reviewed. No pertinent family history.  Social History:  reports that she has never smoked. She does not have any smokeless tobacco history on file. She reports that she does not drink alcohol or use illicit drugs.  Allergies: No Known Allergies  Prescriptions prior to admission  Medication Sig Dispense Refill  . ALPRAZolam (XANAX) 0.5 MG tablet Take 0.5-1 mg by mouth at bedtime as needed. For sleep      . Ascorbic Acid (VITAMIN C PO) Take 1 tablet by mouth daily.      . calcium-vitamin D (OSCAL WITH D) 500-200 MG-UNIT per tablet Take 1 tablet by mouth daily.      . hydrochlorothiazide (HYDRODIURIL) 25 MG tablet Take 12.5 mg by mouth daily.      Marland Kitchen ibuprofen (ADVIL,MOTRIN) 200 MG tablet Take 200 mg by mouth every 6 (six) hours as needed. For pain      . omeprazole (PRILOSEC) 20 MG capsule Take 20 mg by mouth daily.      . quinapril (ACCUPRIL) 20 MG tablet Take 20 mg by mouth at bedtime.        Blood pressure 134/71, pulse 70, temperature 97.9 F (36.6 C), temperature source Oral, resp. rate 16, height 5\' 2"  (1.575 m), weight 82.555 kg (182 lb), SpO2 100.00%.  Pelvic US endometrium 22 mm.  No results found for this or any previous visit (from the past 24 hour(s)).  No results found.  Assessment/Plan: PMB with thick endometrium, stenotic cervix for HSC,  resection, D+C.  Surgery and risks reviewed.  Swade Shonka,MARIE-LYNE 02/28/2012, 11:45 AM

## 2012-02-28 NOTE — Op Note (Signed)
02/28/2012  1:32 PM  PATIENT:  Heidi Fleming  78 y.o. female  PRE-OPERATIVE DIAGNOSIS:  Postmenopausal bleeding, Thick irregular endometrium  POST-OPERATIVE DIAGNOSIS:  postmenopausal bleeding, Intrauterine lesions including myomas and polyps, malignancy not ruled out.  PROCEDURE:  Procedure(s): DILATATION & CURETTAGE/HYSTEROSCOPY WITH RESECTOCOPE  SURGEON:  Surgeon(s): Genia Del, MD  ASSISTANTS: none   ANESTHESIA:   general  PROCEDURE:  Under general anesthesia with laryngeal mask, the patient is in lithotomy position. She is prepped with Betadine on the suprapubic, vulvar and vaginal areas.  She is draped as usual. The vaginal exam reveals an anteverted uterus, normal volume, no adnexal mass.  The speculum is inserted in the vagina. The anterior lip of the cervix is grasped with a tenaculum. A paracervical block is done with Nesacaine 1% a total of 20 cc at 4 and 8:00.  Dilation of the cervix with Hegar dilators up to #27 without difficulty. The diagnostic hysteroscope was inserted in the intrauterine cavity multiple lesions are seen including on submucosal myomas, endometrial polyps and increased vascularity is noted on some lesions.  Malignant process not ruled out.  The diagnostic hysteroscope was therefore removed. The patient is on continued up to Hegar dilator #35 without difficulty. The operative hysteroscope was inserted with a loop. All the lesions were resected. A systematic endometrial curettage was then done with a sharp curet on all intrauterine surfaces. The resected a piece is and the endometrial curettings were sent together to pathology. Hemostasis looked adequate.  All instruments were removed.  Separate stitches of Vicryl 4-0 were done at the right anterior hymen and at the lower left hymen to control hemostasis.  The patient was then brought to recovery room in good and stable status.  ESTIMATED BLOOD LOSS:  75 cc Fluid deficit 800 cc   Intake/Output Summary  (Last 24 hours) at 02/28/12 1332 Last data filed at 02/28/12 1318  Gross per 24 hour  Intake   1100 ml  Output    450 ml  Net    650 ml     BLOOD ADMINISTERED:none   LOCAL MEDICATIONS USED:  Nesacaine 1% 20 cc  SPECIMEN:  Intrauterine lesions, resection and curettings  DISPOSITION OF SPECIMEN:  PATHOLOGY  COUNTS:  YES  PLAN OF CARE: Transfer to PACU  Dr Genia Del  02/28/12  At 1:34 pm

## 2012-02-28 NOTE — Transfer of Care (Signed)
Immediate Anesthesia Transfer of Care Note  Patient: Heidi Fleming  Procedure(s) Performed: Procedure(s) (LRB) with comments: DILATATION & CURETTAGE/HYSTEROSCOPY WITH RESECTOCOPE (N/A) - Requests resectoscope with the loop.  Patient Location: PACU  Anesthesia Type:General  Level of Consciousness: sedated  Airway & Oxygen Therapy: Patient Spontanous Breathing and Patient connected to nasal cannula oxygen  Post-op Assessment: Report given to PACU RN  Post vital signs: Reviewed and stable  Complications: No apparent anesthesia complications

## 2012-03-02 ENCOUNTER — Encounter (HOSPITAL_COMMUNITY): Payer: Self-pay | Admitting: Obstetrics & Gynecology

## 2012-03-09 ENCOUNTER — Encounter: Payer: Self-pay | Admitting: Gynecologic Oncology

## 2012-03-11 ENCOUNTER — Ambulatory Visit: Payer: Medicare Other | Attending: Gynecologic Oncology | Admitting: Gynecologic Oncology

## 2012-03-11 ENCOUNTER — Encounter: Payer: Self-pay | Admitting: Gynecologic Oncology

## 2012-03-11 VITALS — BP 140/68 | HR 60 | Temp 98.2°F | Resp 16 | Ht 60.24 in | Wt 179.5 lb

## 2012-03-11 DIAGNOSIS — I10 Essential (primary) hypertension: Secondary | ICD-10-CM

## 2012-03-11 DIAGNOSIS — C541 Malignant neoplasm of endometrium: Secondary | ICD-10-CM

## 2012-03-11 DIAGNOSIS — C549 Malignant neoplasm of corpus uteri, unspecified: Secondary | ICD-10-CM | POA: Insufficient documentation

## 2012-03-11 DIAGNOSIS — G4730 Sleep apnea, unspecified: Secondary | ICD-10-CM

## 2012-03-11 DIAGNOSIS — E669 Obesity, unspecified: Secondary | ICD-10-CM | POA: Insufficient documentation

## 2012-03-11 NOTE — Patient Instructions (Signed)
Return for pre-op appointment and please start using your CPAP at night and bring your machine to the hospital on the day of your surgery.

## 2012-03-11 NOTE — Progress Notes (Signed)
Consult Note: Gyn-Onc  Heidi Fleming 78 y.o. female  CC:  Chief Complaint  Patient presents with  . Endo ca    New patient    HPI: Patient is seen today in consultation at the request of Dr. Lavoie.  Patient is a 78-year-old gravida 0 virginal female who has been menopausal for greater than 20 years. She's been having some pink spotting for the past few months with no pain associated with it. She states that many times it was just a pink discharge admixed with mucus. She's not sure why she delayed in getting care but ultimately went to see Dr. Lavoie and had ultrasound performed showed a thick irregular endometrium of 22 mm. On November 8 she underwent dilation and curettage hysteroscopy. Operative findings included  endometrial polyps and a submucosal myoma. Pathology revealed a focal endometrioid carcinoma FIGO grade 1 arising in a background of atypical simple and complex hyperplasia. There was fragments of a leiomyoma. It is for this reason that she is referred to us today.  Interval History:  Since her procedure she has been doing well.  Review of Systems: She denies he chest pain. She does admit to some shortness of breath. She states that she does have sleep apnea and is not using her CPAP machine at night. She states she was using of her period time in her house was beginning to wishes to feel comfortable using it. In addition she has not really feel it was helping her feel better. She does have steps with her house and her METs or greater than 4. She has intentionally lost about 17 pounds by decreasing her sweets and the Cokes that she has been drinking. She does complain of low back pain that radiates down her right leg. She's been seeing a chiropractor for this. She had x-rays done at Dr. Kim's office that showed some arthritis. She currently has no numbness however in the past prior to seeing a chiropractor she did have some left foot numbness associated with her back pain. She denies  any nausea, vomiting, fevers, chills. She denies a change in her bowel or bladder habits. Her last mammogram was in 2011 and it was negative. She is due for one this December. She also had a colonoscopy in 2011 at which time there were several precancerous polyps that she is due for a repeat PT colonoscopy in December of this year. 10 point review of systems is otherwise negative.  Current Meds:  Outpatient Encounter Prescriptions as of 03/11/2012  Medication Sig Dispense Refill  . ALPRAZolam (XANAX) 0.5 MG tablet Take 0.5-1 mg by mouth at bedtime as needed. For sleep      . Ascorbic Acid (VITAMIN C PO) Take 1 tablet by mouth daily.      . hydrochlorothiazide (HYDRODIURIL) 25 MG tablet Take 12.5 mg by mouth daily.      . HYDROcodone-acetaminophen (VICODIN) 5-500 MG per tablet Take 1 tablet by mouth every 6 (six) hours as needed for pain.  12 tablet  0  . ibuprofen (ADVIL,MOTRIN) 200 MG tablet Take 200 mg by mouth every 6 (six) hours as needed. For pain      . omeprazole (PRILOSEC) 20 MG capsule Take 20 mg by mouth daily.      . quinapril (ACCUPRIL) 20 MG tablet Take 20 mg by mouth daily.       . VITAMIN D, ERGOCALCIFEROL, PO Take by mouth daily.      . calcium-vitamin D (OSCAL WITH D) 500-200 MG-UNIT per   tablet Take 1 tablet by mouth daily.        Allergy: No Known Allergies  Social Hx:   History   Social History  . Marital Status: Single    Spouse Name: N/A    Number of Children: N/A  . Years of Education: N/A   Occupational History  . Not on file.   Social History Main Topics  . Smoking status: Former Smoker -- 1 years    Quit date: 04/23/1943  . Smokeless tobacco: Not on file  . Alcohol Use: No  . Drug Use: No  . Sexually Active: Not on file   Other Topics Concern  . Not on file   Social History Narrative  . No narrative on file    Past Surgical Hx:  Past Surgical History  Procedure Date  . Dilation and curettage of uterus   . Cataract extraction     bilateral   age 62yo  . Dilatation & currettage/hysteroscopy with resectocope 02/28/2012    Procedure: DILATATION & CURETTAGE/HYSTEROSCOPY WITH RESECTOCOPE;  Surgeon: Marie-Lyne Lavoie, MD;  Location: [facility];  Service: Gynecology;  Laterality: N/A;  Requests resectoscope with the loop.    Past Medical Hx:  Past Medical History  Diagnosis Date  . Hypertension   . Sleep apnea   . GERD (gastroesophageal reflux disease)   . Endometrioid carcinoma   . Irregular uterine bleeding   . Uterine polyp     Family Hx:  Family History  Problem Relation Age of Onset  . Diabetes Father   . Heart disease Sister   . Hypertension Sister   . Heart disease Brother   . Cancer Brother     Vitals:  Blood pressure 140/68, pulse 60, temperature 98.2 F (36.8 C), temperature source Oral, resp. rate 16, height 5' 0.24" (1.53 m), weight 179 lb 8 oz (81.421 kg).  Physical Exam:  First well-developed female in no acute distress.  Neck: Supple, no lymphadenopathy, no thyromegaly.  Lungs: Clear to auscultation bilaterally.  Cardiovascular: Regular rate rhythm.  Abdomen: Soft, nontender, nondistended. There is no palpable masses or hepatosplenomegaly. Groins are negative for adenopathy. Exam is somewhat limited by habitus.   Extremities: No edema.  Pelvic: Normal external female genitalia. Speculum examination is relatively well tolerated by the patient. There are no obvious vaginal lesions. The cervix is without lesions. A unidigital pelvic examination reveals the cervix to be palpably normal. She has a very narrow pubic arch. The uterus is not palpably enlarged there are no adnexal masses but exam is limited by the unidigital nature of the exam.  Assessment/Plan: 78-year-old gravida 0 with focal endometrioid carcinoma FIGO grade 1 arising in a background of atypical simple and complex hyperplasia. I discussed with her and her sister in law who is here with her today that we need to proceed with definitive surgery  including hysterectomy with removal of the uterus, cervix, bilateral adnexa. At the time of surgery the uterus will be sent for frozen section. If the tumor remains a grade 1 and there's less than 50% myometrial invasion we will conclude the procedure. However, if there's increased grade cancer or there's greater than 50% myometrial invasion we'll need to proceed with appropriate lymphadenectomy.  I believe we could attempt minimally invasive fashion. The biggest limitation would be delivery of the uterus secondary to a very narrow pubic arch and virginal introitus. If we cannot proceed in a minimally invasive fashion and would make a vertical midline incision. Risks of surgery including but not limited   to bleeding, infection, injury to surrounding organs and thromboembolic disease were discussed with the patient. She understands how long she will need to be on Lovenox will be dependent on the route of surgery whether or not for significant malignancy identified if she has to have a lymphadenectomy. Her questions as well as those of her sister-in-law were elicited and answered to her satisfaction. It was recommended that she bring her CPAP machine to the hospital on the day of surgery and  that she commence using her CPAP.  Her questions again were elicited in answer to their satisfaction. Her surgeries have her scheduled for 04/07/12.  Aleighna Wojtas A., MD 03/11/2012, 1:08 PM   

## 2012-03-31 ENCOUNTER — Encounter (HOSPITAL_COMMUNITY): Payer: Self-pay | Admitting: Pharmacy Technician

## 2012-03-31 NOTE — Progress Notes (Signed)
Need orders put in EPIC please! Thank you!

## 2012-04-02 ENCOUNTER — Other Ambulatory Visit (HOSPITAL_COMMUNITY): Payer: Self-pay | Admitting: Gynecologic Oncology

## 2012-04-02 NOTE — Patient Instructions (Addendum)
22 Heidi Fleming  04/02/2012   Your procedure is scheduled on:  04-07-2012   Tuesday   Report to [facility] at  0800 AM.  Call this number if you have problems the morning of surgery: [phone]   Remember:   Do not eat food After Midnight Sunday night, clear lioquids all day Monday 04-06-2012, no liquids after midnight Monday night.  .  Take these medicines the morning of surgery with A SIP OF WATER:   PROLISEC BRING CPAP MASK WITH TUBING TO HOSPITAL  Do not wear jewelry or make up.  Do not wear lotions, powders, or perfumes. You may wear deodorant.    Do not bring valuables to the hospital.  Contacts, dentures or bridgework may not be worn into surgery.  Leave suitcase in the car. After surgery it may be brought to your room.  For patients admitted to the hospital, checkout time is 11:00 AM the day of                         discharge                                    Patients discharged the day of surgery will not be allowed to drive home. If going               home same day of surgery, you must have someone stay with you the first 24                   hours at home and arrange for some one to drive you home from hospital.    Special Instructions: See [facility] Preparing for Surgery instruction sheet.                   Women do not shave legs or underarms for 12 hours before showers.     Men may shave face morning of surgery.      Please read over the following fact sheets that you were given: MRSA                               Information, blood fact sheet, incentive spirometer fact sheet, clear liquid sheet                 [place]  [facility] pre op nurse phone number [phone], call if needed                  FAILURE TO FOLLOW THESE INSTRUCTIONS MAY RESULT IN                CANCELLATION OF YOUR SURGERY.                  Patient Signature :___________________________________

## 2012-04-03 ENCOUNTER — Encounter (HOSPITAL_COMMUNITY)
Admission: RE | Admit: 2012-04-03 | Discharge: 2012-04-03 | Disposition: A | Discharge status: Home / Self Care | Payer: Medicare Other | Source: Ambulatory Visit | Attending: Obstetrics & Gynecology | Admitting: Obstetrics & Gynecology

## 2012-04-03 ENCOUNTER — Encounter (HOSPITAL_COMMUNITY): Payer: Self-pay

## 2012-04-03 ENCOUNTER — Ambulatory Visit (HOSPITAL_COMMUNITY)
Admission: RE | Admit: 2012-04-03 | Discharge: 2012-04-03 | Disposition: A | Discharge status: Home / Self Care | Payer: Medicare Other | Source: Ambulatory Visit | Attending: Gynecologic Oncology | Admitting: Gynecologic Oncology

## 2012-04-03 DIAGNOSIS — Z01818 Encounter for other preprocedural examination: Secondary | ICD-10-CM | POA: Insufficient documentation

## 2012-04-03 DIAGNOSIS — Z01812 Encounter for preprocedural laboratory examination: Secondary | ICD-10-CM | POA: Insufficient documentation

## 2012-04-03 DIAGNOSIS — R0602 Shortness of breath: Secondary | ICD-10-CM | POA: Insufficient documentation

## 2012-04-03 HISTORY — DX: Cardiac arrhythmia, unspecified: I49.9

## 2012-04-03 HISTORY — DX: Anxiety disorder, unspecified: F41.9

## 2012-04-03 HISTORY — DX: Other intervertebral disc degeneration, lumbar region: M51.36

## 2012-04-03 HISTORY — DX: Unspecified osteoarthritis, unspecified site: M19.90

## 2012-04-03 HISTORY — DX: Other intervertebral disc degeneration, lumbar region without mention of lumbar back pain or lower extremity pain: M51.369

## 2012-04-03 LAB — COMPREHENSIVE METABOLIC PANEL
BUN: 12 mg/dL (ref 6–23)
CO2: 30 mEq/L (ref 19–32)
Calcium: 9.2 mg/dL (ref 8.4–10.5)
Chloride: 98 mEq/L (ref 96–112)
Creatinine, Ser: 0.81 mg/dL (ref 0.50–1.10)
GFR calc non Af Amer: 67 mL/min — ABNORMAL LOW (ref >90)
Total Bilirubin: 0.6 mg/dL (ref 0.3–1.2)

## 2012-04-03 LAB — CBC WITH DIFFERENTIAL/PLATELET
Basophils Relative: 1 % (ref 0–1)
Eosinophils Relative: 2 % (ref 0–5)
HCT: 39.6 % (ref 36.0–46.0)
Hemoglobin: 13.5 g/dL (ref 12.0–15.0)
MCHC: 34.1 g/dL (ref 30.0–36.0)
MCV: 81.3 fL (ref 78.0–100.0)
Monocytes Absolute: 0.6 10*3/uL (ref 0.1–1.0)
Monocytes Relative: 10 % (ref 3–12)
Neutro Abs: 3.6 10*3/uL (ref 1.7–7.7)
RDW: 15.5 % (ref 11.5–15.5)

## 2012-04-03 NOTE — Progress Notes (Signed)
OV, EKG, stress test and eccho report 8/13, 9/13 Dr Jacinto Halim on chart

## 2012-04-03 NOTE — Progress Notes (Signed)
LOV Dr Esther Hardy on chart- no mention of CPAP settings

## 2012-04-06 MED ORDER — CEFAZOLIN SODIUM-DEXTROSE 2-3 GM-% IV SOLR
2.0000 g | INTRAVENOUS | Status: AC
  Administered 2012-04-07: 2 g via INTRAVENOUS

## 2012-04-07 ENCOUNTER — Encounter (HOSPITAL_COMMUNITY): Payer: Self-pay | Admitting: *Deleted

## 2012-04-07 ENCOUNTER — Inpatient Hospital Stay (HOSPITAL_COMMUNITY)
Admission: RE | Admit: 2012-04-07 | Discharge: 2012-04-09 | DRG: 740 | Disposition: A | Discharge status: Home / Self Care | Payer: Medicare Other | Source: Ambulatory Visit | Attending: Obstetrics & Gynecology | Admitting: Obstetrics & Gynecology

## 2012-04-07 ENCOUNTER — Encounter (HOSPITAL_COMMUNITY)
Admission: RE | Disposition: A | Discharge status: Home / Self Care | Payer: Self-pay | Source: Ambulatory Visit | Attending: Obstetrics & Gynecology

## 2012-04-07 ENCOUNTER — Ambulatory Visit (HOSPITAL_COMMUNITY): Payer: Medicare Other | Admitting: Anesthesiology

## 2012-04-07 ENCOUNTER — Encounter (HOSPITAL_COMMUNITY): Payer: Self-pay | Admitting: Anesthesiology

## 2012-04-07 DIAGNOSIS — D259 Leiomyoma of uterus, unspecified: Secondary | ICD-10-CM | POA: Diagnosis present

## 2012-04-07 DIAGNOSIS — Z6833 Body mass index (BMI) 33.0-33.9, adult: Secondary | ICD-10-CM

## 2012-04-07 DIAGNOSIS — I10 Essential (primary) hypertension: Secondary | ICD-10-CM | POA: Diagnosis present

## 2012-04-07 DIAGNOSIS — N8501 Benign endometrial hyperplasia: Secondary | ICD-10-CM | POA: Diagnosis present

## 2012-04-07 DIAGNOSIS — G4730 Sleep apnea, unspecified: Secondary | ICD-10-CM | POA: Diagnosis present

## 2012-04-07 DIAGNOSIS — N135 Crossing vessel and stricture of ureter without hydronephrosis: Secondary | ICD-10-CM | POA: Diagnosis present

## 2012-04-07 DIAGNOSIS — K219 Gastro-esophageal reflux disease without esophagitis: Secondary | ICD-10-CM | POA: Diagnosis present

## 2012-04-07 DIAGNOSIS — E871 Hypo-osmolality and hyponatremia: Secondary | ICD-10-CM | POA: Diagnosis present

## 2012-04-07 DIAGNOSIS — C549 Malignant neoplasm of corpus uteri, unspecified: Principal | ICD-10-CM | POA: Diagnosis present

## 2012-04-07 DIAGNOSIS — C541 Malignant neoplasm of endometrium: Secondary | ICD-10-CM

## 2012-04-07 DIAGNOSIS — K660 Peritoneal adhesions (postprocedural) (postinfection): Secondary | ICD-10-CM | POA: Diagnosis present

## 2012-04-07 DIAGNOSIS — Z79899 Other long term (current) drug therapy: Secondary | ICD-10-CM

## 2012-04-07 HISTORY — PX: LYMPH NODE DISSECTION: SHX5087

## 2012-04-07 HISTORY — PX: ROBOTIC ASSISTED TOTAL HYSTERECTOMY WITH BILATERAL SALPINGO OOPHERECTOMY: SHX6086

## 2012-04-07 LAB — TYPE AND SCREEN
ABO/RH(D): O POS
Antibody Screen: NEGATIVE

## 2012-04-07 SURGERY — ROBOTIC ASSISTED TOTAL HYSTERECTOMY WITH BILATERAL SALPINGO OOPHORECTOMY
Anesthesia: General | Laterality: Right | Wound class: Clean Contaminated

## 2012-04-07 MED ORDER — EPHEDRINE SULFATE 50 MG/ML IJ SOLN
INTRAMUSCULAR | Status: DC | PRN
  Administered 2012-04-07: 5 mg via INTRAVENOUS

## 2012-04-07 MED ORDER — ALPRAZOLAM 0.5 MG PO TABS
0.5000 mg | ORAL_TABLET | Freq: Every evening | ORAL | Status: DC | PRN
  Administered 2012-04-08 – 2012-04-09 (×3): 0.5 mg via ORAL
  Filled 2012-04-07 (×3): qty 1

## 2012-04-07 MED ORDER — ONDANSETRON HCL 4 MG PO TABS
4.0000 mg | ORAL_TABLET | Freq: Four times a day (QID) | ORAL | Status: DC | PRN
  Administered 2012-04-09: 4 mg via ORAL
  Filled 2012-04-07: qty 1

## 2012-04-07 MED ORDER — LACTATED RINGERS IR SOLN
Status: DC | PRN
  Administered 2012-04-07: 1000 mL

## 2012-04-07 MED ORDER — QUINAPRIL HCL 10 MG PO TABS: 20.0000 mg | ORAL_TABLET | Freq: Every morning | ORAL | Status: DC

## 2012-04-07 MED ORDER — OXYCODONE-ACETAMINOPHEN 5-325 MG PO TABS
1.0000 | ORAL_TABLET | ORAL | Status: DC | PRN
  Administered 2012-04-08: 1 via ORAL
  Administered 2012-04-09: 2 via ORAL
  Filled 2012-04-07: qty 2
  Filled 2012-04-07: qty 1

## 2012-04-07 MED ORDER — STERILE WATER FOR IRRIGATION IR SOLN
Status: DC | PRN
  Administered 2012-04-07: 3000 mL

## 2012-04-07 MED ORDER — PROMETHAZINE HCL 25 MG/ML IJ SOLN: 6.2500 mg | INTRAMUSCULAR | Status: DC | PRN

## 2012-04-07 MED ORDER — ACETAMINOPHEN 10 MG/ML IV SOLN
INTRAVENOUS | Status: DC | PRN
  Administered 2012-04-07: 1000 mg via INTRAVENOUS

## 2012-04-07 MED ORDER — PROPOFOL 10 MG/ML IV BOLUS
INTRAVENOUS | Status: DC | PRN
  Administered 2012-04-07: 100 mg via INTRAVENOUS

## 2012-04-07 MED ORDER — HYDROMORPHONE HCL PF 1 MG/ML IJ SOLN
INTRAMUSCULAR | Status: DC | PRN
  Administered 2012-04-07 (×2): 1 mg via INTRAVENOUS

## 2012-04-07 MED ORDER — LISINOPRIL 20 MG PO TABS
20.0000 mg | ORAL_TABLET | Freq: Every day | ORAL | Status: DC
  Administered 2012-04-07 – 2012-04-09 (×2): 20 mg via ORAL
  Filled 2012-04-07 (×3): qty 1

## 2012-04-07 MED ORDER — ACETAMINOPHEN 10 MG/ML IV SOLN
INTRAVENOUS | Status: AC
  Filled 2012-04-07: qty 100

## 2012-04-07 MED ORDER — ONDANSETRON HCL 4 MG/2ML IJ SOLN
4.0000 mg | Freq: Four times a day (QID) | INTRAMUSCULAR | Status: DC | PRN
  Administered 2012-04-07 – 2012-04-08 (×2): 4 mg via INTRAVENOUS
  Filled 2012-04-07 (×2): qty 2

## 2012-04-07 MED ORDER — GLYCOPYRROLATE 0.2 MG/ML IJ SOLN
INTRAMUSCULAR | Status: DC | PRN
  Administered 2012-04-07: .7 mg via INTRAVENOUS

## 2012-04-07 MED ORDER — ACETAMINOPHEN 10 MG/ML IV SOLN
1000.0000 mg | Freq: Four times a day (QID) | INTRAVENOUS | Status: AC
  Administered 2012-04-07 – 2012-04-08 (×3): 1000 mg via INTRAVENOUS
  Filled 2012-04-07 (×4): qty 100

## 2012-04-07 MED ORDER — HYDROMORPHONE HCL PF 1 MG/ML IJ SOLN
0.2500 mg | INTRAMUSCULAR | Status: DC | PRN
  Administered 2012-04-07 (×2): 0.25 mg via INTRAVENOUS

## 2012-04-07 MED ORDER — KCL IN DEXTROSE-NACL 20-5-0.45 MEQ/L-%-% IV SOLN
INTRAVENOUS | Status: DC
  Administered 2012-04-07: 16:00:00 via INTRAVENOUS
  Administered 2012-04-08: 100 mL via INTRAVENOUS
  Filled 2012-04-07 (×3): qty 1000

## 2012-04-07 MED ORDER — LIDOCAINE HCL (CARDIAC) 20 MG/ML IV SOLN
INTRAVENOUS | Status: DC | PRN
  Administered 2012-04-07: 60 mg via INTRAVENOUS
  Administered 2012-04-07: 50 mg via INTRAVENOUS

## 2012-04-07 MED ORDER — HYDROMORPHONE HCL PF 1 MG/ML IJ SOLN
INTRAMUSCULAR | Status: AC
  Filled 2012-04-07: qty 1

## 2012-04-07 MED ORDER — NEOSTIGMINE METHYLSULFATE 1 MG/ML IJ SOLN
INTRAMUSCULAR | Status: DC | PRN
  Administered 2012-04-07: 4 mg via INTRAVENOUS

## 2012-04-07 MED ORDER — FENTANYL CITRATE 0.05 MG/ML IJ SOLN
INTRAMUSCULAR | Status: DC | PRN
  Administered 2012-04-07 (×5): 50 ug via INTRAVENOUS

## 2012-04-07 MED ORDER — LACTATED RINGERS IV SOLN
INTRAVENOUS | Status: DC
  Administered 2012-04-07: 1000 mL via INTRAVENOUS
  Administered 2012-04-07: 12:00:00 via INTRAVENOUS

## 2012-04-07 MED ORDER — CEFAZOLIN SODIUM-DEXTROSE 2-3 GM-% IV SOLR
INTRAVENOUS | Status: AC
  Filled 2012-04-07: qty 50

## 2012-04-07 MED ORDER — ZOLPIDEM TARTRATE 5 MG PO TABS: 5.0000 mg | ORAL_TABLET | Freq: Every evening | ORAL | Status: DC | PRN

## 2012-04-07 MED ORDER — ROCURONIUM BROMIDE 100 MG/10ML IV SOLN
INTRAVENOUS | Status: DC | PRN
  Administered 2012-04-07 (×3): 10 mg via INTRAVENOUS
  Administered 2012-04-07: 30 mg via INTRAVENOUS

## 2012-04-07 MED ORDER — MORPHINE SULFATE 2 MG/ML IJ SOLN: 2.0000 mg | INTRAMUSCULAR | Status: DC | PRN

## 2012-04-07 SURGICAL SUPPLY — 53 items
APL SKNCLS STERI-STRIP NONHPOA (GAUZE/BANDAGES/DRESSINGS) ×2
BAG SPEC RTRVL LRG 6X4 10 (ENDOMECHANICALS) ×2
BENZOIN TINCTURE PRP APPL 2/3 (GAUZE/BANDAGES/DRESSINGS) ×3 IMPLANT
CHLORAPREP W/TINT 26ML (MISCELLANEOUS) ×3 IMPLANT
CLOTH BEACON ORANGE TIMEOUT ST (SAFETY) ×3 IMPLANT
CORD HIGH FREQUENCY UNIPOLAR (ELECTROSURGICAL) ×3 IMPLANT
CORDS BIPOLAR (ELECTRODE) ×3 IMPLANT
COVER MAYO STAND STRL (DRAPES) ×3 IMPLANT
COVER SURGICAL LIGHT HANDLE (MISCELLANEOUS) ×3 IMPLANT
COVER TIP SHEARS 8 DVNC (MISCELLANEOUS) ×2 IMPLANT
COVER TIP SHEARS 8MM DA VINCI (MISCELLANEOUS) ×1
DECANTER SPIKE VIAL GLASS SM (MISCELLANEOUS) ×2 IMPLANT
DRAPE LG THREE QUARTER DISP (DRAPES) ×6 IMPLANT
DRAPE SURG IRRIG POUCH 19X23 (DRAPES) ×3 IMPLANT
DRAPE TABLE BACK 44X90 PK DISP (DRAPES) ×3 IMPLANT
DRAPE UTILITY XL STRL (DRAPES) ×3 IMPLANT
DRAPE WARM FLUID 44X44 (DRAPE) ×3 IMPLANT
DRSG TEGADERM 2-3/8X2-3/4 SM (GAUZE/BANDAGES/DRESSINGS) ×1 IMPLANT
DRSG TEGADERM 6X8 (GAUZE/BANDAGES/DRESSINGS) ×6 IMPLANT
ELECT REM PT RETURN 9FT ADLT (ELECTROSURGICAL) ×3
ELECTRODE REM PT RTRN 9FT ADLT (ELECTROSURGICAL) ×2 IMPLANT
GAUZE VASELINE 3X9 (GAUZE/BANDAGES/DRESSINGS) IMPLANT
GLOVE BIO SURGEON STRL SZ 6.5 (GLOVE) ×12 IMPLANT
GLOVE BIO SURGEON STRL SZ7.5 (GLOVE) ×6 IMPLANT
GLOVE BIOGEL PI IND STRL 7.0 (GLOVE) ×4 IMPLANT
GLOVE BIOGEL PI INDICATOR 7.0 (GLOVE) ×2
GOWN PREVENTION PLUS XLARGE (GOWN DISPOSABLE) ×15 IMPLANT
HOLDER FOLEY CATH W/STRAP (MISCELLANEOUS) ×3 IMPLANT
KIT ACCESSORY DA VINCI DISP (KITS) ×1
KIT ACCESSORY DVNC DISP (KITS) ×2 IMPLANT
MANIPULATOR UTERINE 4.5 ZUMI (MISCELLANEOUS) ×3 IMPLANT
OCCLUDER COLPOPNEUMO (BALLOONS) ×3 IMPLANT
PACK LAPAROSCOPY W LONG (CUSTOM PROCEDURE TRAY) ×3 IMPLANT
POUCH SPECIMEN RETRIEVAL 10MM (ENDOMECHANICALS) ×5 IMPLANT
SET TUBE IRRIG SUCTION NO TIP (IRRIGATION / IRRIGATOR) ×3 IMPLANT
SHEET LAVH (DRAPES) ×3 IMPLANT
SOLUTION ELECTROLUBE (MISCELLANEOUS) ×3 IMPLANT
SPONGE LAP 18X18 X RAY DECT (DISPOSABLE) IMPLANT
STRIP CLOSURE SKIN 1/2X4 (GAUZE/BANDAGES/DRESSINGS) ×2 IMPLANT
SUT VIC AB 0 CT1 27 (SUTURE) ×3
SUT VIC AB 0 CT1 27XBRD ANTBC (SUTURE) ×6 IMPLANT
SUT VIC AB 3-0 SH 27 (SUTURE) ×3
SUT VIC AB 3-0 SH 27XBRD (SUTURE) IMPLANT
SUT VIC AB 4-0 PS2 27 (SUTURE) ×6 IMPLANT
SUT VICRYL 0 UR6 27IN ABS (SUTURE) ×3 IMPLANT
SYR 50ML LL SCALE MARK (SYRINGE) ×3 IMPLANT
SYR BULB IRRIGATION 50ML (SYRINGE) IMPLANT
TRAP SPECIMEN MUCOUS 40CC (MISCELLANEOUS) ×2 IMPLANT
TRAY FOLEY CATH 14FRSI W/METER (CATHETERS) ×3 IMPLANT
TROCAR 12M 150ML BLUNT (TROCAR) ×1 IMPLANT
TROCAR XCEL 12X100 BLDLESS (ENDOMECHANICALS) ×3 IMPLANT
TROCAR XCEL BLADELESS 5X75MML (TROCAR) ×3 IMPLANT
WATER STERILE IRR 1500ML POUR (IV SOLUTION) ×4 IMPLANT

## 2012-04-07 NOTE — Anesthesia Postprocedure Evaluation (Signed)
  Anesthesia Post-op Note  Patient: Heidi Fleming  Procedure(s) Performed: Procedure(s) (LRB): ROBOTIC ASSISTED TOTAL HYSTERECTOMY WITH BILATERAL SALPINGO OOPHORECTOMY (N/A) LYMPH NODE DISSECTION (Right)  Patient Location: PACU  Anesthesia Type: General  Level of Consciousness: awake and alert   Airway and Oxygen Therapy: Patient Spontanous Breathing  Post-op Pain: mild  Post-op Assessment: Post-op Vital signs reviewed, Patient's Cardiovascular Status Stable, Respiratory Function Stable, Patent Airway and No signs of Nausea or vomiting  Last Vitals:  Filed Vitals:   04/07/12 1430  BP: 140/95  Pulse: 74  Temp: 36.3 C  Resp: 12    Post-op Vital Signs: stable   Complications: No apparent anesthesia complications

## 2012-04-07 NOTE — Op Note (Signed)
PATIENT: Heidi Fleming DATE OF BIRTH: [birth year] ENCOUNTER DATE: 04/07/2012   Preop Diagnosis: grade 1 endometrioid adenocarcinoma.   Postoperative Diagnosis: same and bilateral retroperitoneal fibrosis  Surgery: Total robotic hysterectomy bilateral salpingo-oophorectomy, right pelvic lymph node dissection, extensive lysis of adhesions, bilateral ureterolysis  Surgeons:  Prakriti Carignan A. Duard Brady, MD; Antionette Char, MD   Assistant: Telford Nab   Anesthesia: General   Estimated blood loss: 100 ml   IVF: 1500 ml   Urine output: 700 ml   Complications: None   Pathology: Uterus, cervix, bilateral tubes and ovaries, right pelvic lymph nodes to pathology.   Operative findings: Uterus with dense adhesions to the rectosigmoid colon with obliteration of the cul de sac.  Bilateral ovaries adherent in the midline.  Bilateral retroperitoneal fibrosis, grade 1 lesions with < 50% myometrial invasion on frozen section.  Procedure: The patient was identified in the preoperative holding area. Informed consent was signed on the chart. Patient was seen history was reviewed and exam was performed.   The patient was then taken to the operating room and placed in the supine position with SCD hose on. General anesthesia was then induced without difficulty. She was then placed in the dorsolithotomy position. Her arms were tucked at her side with appropriate precautions on the gel pad. Shoulder blocks were then placed in the usual fashion with appropriate precautions. A OG-tube was placed to suction. First timeout was performed to confirm the patient procedure antibiotic allergy status, estimated estimated blood loss and OR time. The perineum was then prepped in the usual fashion with Betadine. A 14 French Foley was inserted into the bladder under sterile conditions. A sterile speculum was placed in the vagina. The cervix was without lesions. The cervix was grasped with a single-tooth tenaculum. The dilator  without difficulty. A ZUMI with a small Koe ring was placed without difficulty. There was a small vaginal laceration that was bleeding and was repaired with a single figure of eight suture of 3.0 vicryl.  The abdomen was then prepped with 1 Chlor prep sponge per protocol. Patient was then draped after the prep was dried. Second timeout was performed to confirm the above. After again confirming OG tube placement and it was to suction. A stab-wound was made in left upper quadrant 2 cm below the costal margin on the left in the midclavicular line. A 5 mm operative report was used to assure intra-abdominal placement. The abdomen was insufflated. At this point all points during the procedure the patient's intra-abdominal pressure was not increased over 15 mm of mercury. After insufflation was complete, the patient was placed in deep Trendelenburg position. 25 cm above the pubic symphysis that area was marked the camera port. Bilateral robotic ports were marked 10 cm from the midline incision at approximately 5 angle and the 4th arm was marked 2 cm medial and superior to the ACIS. Under direct visualization each of the trochars was placed into the abdomen. The small bowel was folded on its mesentery to allow visualization to the pelvis. The 5 mm LUQ port was then converted to a 10/12 port under direct visualization.  After assuring adequate visualization, the robot was then docked in the usual fashion. Under direct visualization the robotic instruments replaced.   Using meticulous dissection the ovaries were separated in the midline.  The rectosigmoid colon was dissected free of the posterior aspect of the uterus.  Lysis of adhesion and mobilization for 1 hour to free the ureters and the sigmoid colon to completely reconstruct the  cul de sac. The round ligament on the patient's right side was transected with monopolar cautery. The anterior and posterior leaves of the broad ligament were then taken down in the usual  fashion. The ureter was identified on the medial leaf of the broad ligament. A window was made between the IP and the ureter. The IP was coagulated with bipolar cautery and transected The bladder flap was created using meticulous dissection and pinpoint cautery. The uterine vessels were coagulated with bipolar cautery. The uterine vessels were then transected and the C loop was created. The same procedure was performed on the patient's left side. There was more fibrosis on the left than on the right.  The pneumo-occulder in the vagina was then insufflated. The colpotomy was then created in the usual fashion. The specimen was then delivered to the vagina and sent for frozen section there was some bleeding noted on the uterine vessels on her left side. These were coagulated with bipolar cautery. Our attention was then drawn to opening the paravesical space on her right side the perirectal space was also opened. The obturator nerve was identified. The nodal bundle extending over the external iliac artery down to the external iliac vein was taken down using sharp dissection and monopolar cautery. The genitofemoral nerve was identified and spared. We continued our dissection down to the level of the obturator nerve.  All pedicles were noted to be hemostatic the ureter was noted to be well medial of the area of dissection. The nodal bundle was then placed and an Endo catch bag.   All specimens were delivered to the vagina. At this point frozen section returned as minimal myometrial invasion of grade 1 lesion. The decision was made to stop the procedure is no further lymphadenectomy was indicated.   The vaginal cuff was closed with a running 0 Vicryl on CT 1 suture. The abdomen and pelvis were copiously irrigated and noted to be hemostatic. The robotic instruments were removed under direct visualization as were the robotic trochars. The pneumoperitoneum was removed. The patient was then taken out of the Trendelenburg  position. Using of 0 Vicryl on a UR 6 needle the midline port fascia was closed after being grasped with Allis clamps. The subcutaneous tissue in the left upper quadrant port was reapproximated. The skin was closed using 4-0 Vicryl. Steri-Strips and benzoin were applied. The pneumo occluded balloon was removed from the vagina. The vagina was swabbed and noted to be hemostatic.  All instrument needle and Ray-Tec counts were correct x2. The patient tolerated the procedure well and was taken to the recovery room in stable condition. This is [facility] dictating an operative note on patient Heidi Fleming.

## 2012-04-07 NOTE — Transfer of Care (Signed)
Immediate Anesthesia Transfer of Care Note  Patient: Heidi Fleming  Procedure(s) Performed: Procedure(s) (LRB): ROBOTIC ASSISTED TOTAL HYSTERECTOMY WITH BILATERAL SALPINGO OOPHORECTOMY (N/A) LYMPH NODE DISSECTION (Right)  Patient Location: PACU  Anesthesia Type: General  Level of Consciousness: sedated, patient cooperative and responds to stimulaton  Airway & Oxygen Therapy: Patient Spontanous Breathing and Patient connected to face mask oxgen  Post-op Assessment: Report given to PACU RN and Post -op Vital signs reviewed and stable  Post vital signs: Reviewed and stable  Complications: No apparent anesthesia complications

## 2012-04-07 NOTE — Interval H&P Note (Signed)
History and Physical Interval Note:  04/07/2012 10:28 AM  Heidi Fleming  has presented today for surgery, with the diagnosis of ENDOMETRIAL CANCER  The various methods of treatment have been discussed with the patient and family. After consideration of risks, benefits and other options for treatment, the patient has consented to  Procedure(s) (LRB) with comments: ROBOTIC ASSISTED TOTAL HYSTERECTOMY WITH BILATERAL SALPINGO OOPHORECTOMY (N/A) - POSSIBLE LYMPH NODE as a surgical intervention .  The patient's history has been reviewed, patient examined, no change in status, stable for surgery.  I have reviewed the patient's chart and labs.  Questions were answered to the patient's satisfaction.     Marylynne Keelin A.

## 2012-04-07 NOTE — Preoperative (Signed)
Beta Blockers   Reason not to administer Beta Blockers:Not Applicable, not on home BB 

## 2012-04-07 NOTE — Anesthesia Preprocedure Evaluation (Addendum)
Anesthesia Evaluation  Patient identified by MRN, date of birth, ID band Patient awake    Reviewed: Allergy & Precautions, H&P , NPO status , Patient's Chart, lab work & pertinent test results, reviewed documented beta blocker date and time   Airway Mallampati: II TM Distance: >3 FB Neck ROM: full    Dental No notable dental hx.    Pulmonary neg pulmonary ROS, sleep apnea , former smoker,  breath sounds clear to auscultation  Pulmonary exam normal       Cardiovascular Exercise Tolerance: Good hypertension, On Medications negative cardio ROS  + dysrhythmias Rhythm:regular Rate:Normal  Full cardiology workup in September 2013. Low risk stress test and echo with diastolic dysfunction.   Neuro/Psych Anxiety negative neurological ROS  negative psych ROS   GI/Hepatic negative GI ROS, Neg liver ROS, GERD-  Medicated,  Endo/Other  Morbid obesity  Renal/GU negative Renal ROS  negative genitourinary   Musculoskeletal   Abdominal   Peds  Hematology negative hematology ROS (+)   Anesthesia Other Findings   Reproductive/Obstetrics negative OB ROS                         Anesthesia Physical Anesthesia Plan  ASA: II  Anesthesia Plan: General ETT   Post-op Pain Management:    Induction:   Airway Management Planned:   Additional Equipment:   Intra-op Plan:   Post-operative Plan:   Informed Consent: I have reviewed the patients History and Physical, chart, labs and discussed the procedure including the risks, benefits and alternatives for the proposed anesthesia with the patient or authorized representative who has indicated his/her understanding and acceptance.   Dental Advisory Given  Plan Discussed with: CRNA  Anesthesia Plan Comments:         Anesthesia Quick Evaluation

## 2012-04-07 NOTE — H&P (View-Only) (Signed)
Consult Note: Gyn-Onc  Heidi Fleming 78 y.o. female  CC:  Chief Complaint  Patient presents with  . Endo ca    New patient    HPI: Patient is seen today in consultation at the request of Dr. Seymour Bars.  Patient is a 78 year old gravida 72 virginal female who has been menopausal for greater than 20 years. She's been having some pink spotting for the past few months with no pain associated with it. She states that many times it was just a pink discharge admixed with mucus. She's not sure why she delayed in getting care but ultimately went to see Dr. Seymour Bars and had ultrasound performed showed a thick irregular endometrium of 22 mm. On November 8 she underwent dilation and curettage hysteroscopy. Operative findings included  endometrial polyps and a submucosal myoma. Pathology revealed a focal endometrioid carcinoma FIGO grade 1 arising in a background of atypical simple and complex hyperplasia. There was fragments of a leiomyoma. It is for this reason that she is referred to [place] today.  Interval History:  Since her procedure she has been doing well.  Review of Systems: She denies he chest pain. She does admit to some shortness of breath. She states that she does have sleep apnea and is not using her CPAP machine at night. She states she was using of her period time in her house was beginning to wishes to feel comfortable using it. In addition she has not really feel it was helping her feel better. She does have steps with her house and her METs or greater than 4. She has intentionally lost about 17 pounds by decreasing her sweets and the Cokes that she has been drinking. She does complain of low back pain that radiates down her right leg. She's been seeing a chiropractor for this. She had x-rays done at Dr. Elmyra Ricks office that showed some arthritis. She currently has no numbness however in the past prior to seeing a chiropractor she did have some left foot numbness associated with her back pain. She denies  any nausea, vomiting, fevers, chills. She denies a change in her bowel or bladder habits. Her last mammogram was in 2011 and it was negative. She is due for one this December. She also had a colonoscopy in 2011 at which time there were several precancerous polyps that she is due for a repeat PT colonoscopy in December of this year. 10 point review of systems is otherwise negative.  Current Meds:  Outpatient Encounter Prescriptions as of 03/11/2012  Medication Sig Dispense Refill  . ALPRAZolam (XANAX) 0.5 MG tablet Take 0.5-1 mg by mouth at bedtime as needed. For sleep      . Ascorbic Acid (VITAMIN C PO) Take 1 tablet by mouth daily.      . hydrochlorothiazide (HYDRODIURIL) 25 MG tablet Take 12.5 mg by mouth daily.      Marland Kitchen HYDROcodone-acetaminophen (VICODIN) 5-500 MG per tablet Take 1 tablet by mouth every 6 (six) hours as needed for pain.  12 tablet  0  . ibuprofen (ADVIL,MOTRIN) 200 MG tablet Take 200 mg by mouth every 6 (six) hours as needed. For pain      . omeprazole (PRILOSEC) 20 MG capsule Take 20 mg by mouth daily.      . quinapril (ACCUPRIL) 20 MG tablet Take 20 mg by mouth daily.       Marland Kitchen VITAMIN D, ERGOCALCIFEROL, PO Take by mouth daily.      . calcium-vitamin D (OSCAL WITH D) 500-200 MG-UNIT per  tablet Take 1 tablet by mouth daily.        Allergy: No Known Allergies  Social Hx:   History   Social History  . Marital Status: Single    Spouse Name: N/A    Number of Children: N/A  . Years of Education: N/A   Occupational History  . Not on file.   Social History Main Topics  . Smoking status: Former Smoker -- 1 years    Quit date: 04/23/1943  . Smokeless tobacco: Not on file  . Alcohol Use: No  . Drug Use: No  . Sexually Active: Not on file   Other Topics Concern  . Not on file   Social History Narrative  . No narrative on file    Past Surgical Hx:  Past Surgical History  Procedure Date  . Dilation and curettage of uterus   . Cataract extraction     bilateral   age 27yo  . Dilatation & currettage/hysteroscopy with resectocope 02/28/2012    Procedure: DILATATION & CURETTAGE/HYSTEROSCOPY WITH RESECTOCOPE;  Surgeon: Genia Del, MD;  Location: [facility];  Service: Gynecology;  Laterality: N/A;  Requests resectoscope with the loop.    Past Medical Hx:  Past Medical History  Diagnosis Date  . Hypertension   . Sleep apnea   . GERD (gastroesophageal reflux disease)   . Endometrioid carcinoma   . Irregular uterine bleeding   . Uterine polyp     Family Hx:  Family History  Problem Relation Age of Onset  . Diabetes Father   . Heart disease Sister   . Hypertension Sister   . Heart disease Brother   . Cancer Brother     Vitals:  Blood pressure 140/68, pulse 60, temperature 98.2 F (36.8 C), temperature source Oral, resp. rate 16, height 5' 0.24" (1.53 m), weight 179 lb 8 oz (81.421 kg).  Physical Exam:  First well-developed female in no acute distress.  Neck: Supple, no lymphadenopathy, no thyromegaly.  Lungs: Clear to auscultation bilaterally.  Cardiovascular: Regular rate rhythm.  Abdomen: Soft, nontender, nondistended. There is no palpable masses or hepatosplenomegaly. Groins are negative for adenopathy. Exam is somewhat limited by habitus.   Extremities: No edema.  Pelvic: Normal external female genitalia. Speculum examination is relatively well tolerated by the patient. There are no obvious vaginal lesions. The cervix is without lesions. A unidigital pelvic examination reveals the cervix to be palpably normal. She has a very narrow pubic arch. The uterus is not palpably enlarged there are no adnexal masses but exam is limited by the unidigital nature of the exam.  Assessment/Plan: 78 year old gravida 0 with focal endometrioid carcinoma FIGO grade 1 arising in a background of atypical simple and complex hyperplasia. I discussed with her and her sister in law who is here with her today that we need to proceed with definitive surgery  including hysterectomy with removal of the uterus, cervix, bilateral adnexa. At the time of surgery the uterus will be sent for frozen section. If the tumor remains a grade 1 and there's less than 50% myometrial invasion we will conclude the procedure. However, if there's increased grade cancer or there's greater than 50% myometrial invasion we'll need to proceed with appropriate lymphadenectomy.  I believe we could attempt minimally invasive fashion. The biggest limitation would be delivery of the uterus secondary to a very narrow pubic arch and virginal introitus. If we cannot proceed in a minimally invasive fashion and would make a vertical midline incision. Risks of surgery including but not limited  to bleeding, infection, injury to surrounding organs and thromboembolic disease were discussed with the patient. She understands how long she will need to be on Lovenox will be dependent on the route of surgery whether or not for significant malignancy identified if she has to have a lymphadenectomy. Her questions as well as those of her sister-in-law were elicited and answered to her satisfaction. It was recommended that she bring her CPAP machine to the hospital on the day of surgery and  that she commence using her CPAP.  Her questions again were elicited in answer to their satisfaction. Her surgeries have her scheduled for 04/07/12.  Anda Sobotta A., MD 03/11/2012, 1:08 PM

## 2012-04-08 ENCOUNTER — Encounter (HOSPITAL_COMMUNITY): Payer: Self-pay | Admitting: Gynecologic Oncology

## 2012-04-08 LAB — BASIC METABOLIC PANEL
BUN: 9 mg/dL (ref 6–23)
BUN: 12 mg/dL (ref 6–23)
Calcium: 8.5 mg/dL (ref 8.4–10.5)
GFR calc non Af Amer: 63 mL/min — ABNORMAL LOW (ref >90)
GFR calc non Af Amer: 53 mL/min — ABNORMAL LOW (ref >90)
Glucose, Bld: 133 mg/dL — ABNORMAL HIGH (ref 70–99)
Glucose, Bld: 114 mg/dL — ABNORMAL HIGH (ref 70–99)
Potassium: 3.4 mEq/L — ABNORMAL LOW (ref 3.5–5.1)
Sodium: 128 mEq/L — ABNORMAL LOW (ref 135–145)

## 2012-04-08 LAB — URINALYSIS, ROUTINE W REFLEX MICROSCOPIC
Bilirubin Urine: NEGATIVE
Ketones, ur: NEGATIVE mg/dL
Nitrite: NEGATIVE
Urobilinogen, UA: 0.2 mg/dL (ref 0.0–1.0)

## 2012-04-08 LAB — CBC
Hemoglobin: 11.7 g/dL — ABNORMAL LOW (ref 12.0–15.0)
MCH: 27.6 pg (ref 26.0–34.0)
MCHC: 33.8 g/dL (ref 30.0–36.0)

## 2012-04-08 MED ORDER — SODIUM CHLORIDE 0.9 % IV BOLUS (SEPSIS)
250.0000 mL | Freq: Once | INTRAVENOUS | Status: AC
  Administered 2012-04-08: 250 mL via INTRAVENOUS

## 2012-04-08 MED ORDER — BISACODYL 10 MG RE SUPP
10.0000 mg | Freq: Every day | RECTAL | Status: DC | PRN
  Administered 2012-04-08: 10 mg via RECTAL
  Filled 2012-04-08: qty 1

## 2012-04-08 MED ORDER — KCL IN DEXTROSE-NACL 20-5-0.9 MEQ/L-%-% IV SOLN
INTRAVENOUS | Status: DC
  Filled 2012-04-08: qty 1000

## 2012-04-08 NOTE — Progress Notes (Signed)
Utilization review completed.  

## 2012-04-08 NOTE — Progress Notes (Signed)
1 Day Post-Op Procedure(s) (LRB): ROBOTIC ASSISTED TOTAL HYSTERECTOMY WITH BILATERAL SALPINGO OOPHORECTOMY (N/A) LYMPH NODE DISSECTION (Right)  Subjective: Patient reports tolerating solid food and minimal abdominal pain.  She is currently due to void since foley removal.  Denies the urge to void or feeling like her bladder is full.  Denies history of urinary retention in the past.  Reports that she feels like she needs to have a bowel movement but can't.  Denies nausea, vomiting, passing flatus, or having a bowel movement.    Objective: Vital signs in last 24 hours: Temp:  [97.9 F (36.6 C)-98.3 F (36.8 C)] 97.9 F (36.6 C) (12/18 1400) Pulse Rate:  [71-90] 83  (12/18 1400) Resp:  [18-20] 18  (12/18 1400) BP: (102-120)/(64-70) 120/70 mmHg (12/18 1400) SpO2:  [96 %-98 %] 97 % (12/18 1400) Last BM Date: 04/06/12  Intake/Output from previous day: 12/17 0701 - 12/18 0700 In: 3431.7 [P.O.:120; I.V.:3311.7] Out: 1730 [Urine:1630; Blood:100]  Physical Examination: General: alert, cooperative and no distress Resp: clear to auscultation bilaterally Cardio: regular rate and rhythm, S1, S2 normal, no murmur, click, rub or gallop GI: soft, non-tender; bowel sounds normal; no masses,  no organomegaly and incision: lap sites with steri strips dry and intact Extremities: extremities normal, atraumatic, no cyanosis or edema  Labs: WBC/Hgb/Hct/Plts:  13.3/11.7/34.6/249 (12/18 0519) BUN/Cr/glu/ALT/AST/amyl/lip:  12/0.98/--/--/--/--/-- (12/18 1351)  Assessment: 78 y.o. s/p Procedure(s): ROBOTIC ASSISTED TOTAL HYSTERECTOMY WITH BILATERAL SALPINGO OOPHORECTOMY LYMPH NODE DISSECTION: stable Pain:  Pain is well-controlled on oral medications.  Heme:  Hgb 11.7 and Hct 34.6 this am.  CV:  History of elevated BP.  Currently taking Quinapril.  GI:  Tolerating po: Yes    FEN:  Mild hyponatremia this am of 127 with repeat this afternoon of 128: Stable.  Mild hypokalemia this am of 3.4 with repeat  K+ of 3.5 this afternoon.  Prophylaxis: intermittent pneumatic compression boots.  Plan: 250 cc normal saline bolus now Replace foley catheter and send urine for UA and culture if needed Dulcolax suppository PRN Hold discharge at this time Continue post-operative plan of care at this time   LOS: 1 day    Elieser Tetrick DEAL 04/08/2012, 6:49 PM

## 2012-04-08 NOTE — Care Management Note (Signed)
    Page 1 of 1   04/08/2012     1:25:20 PM   CARE MANAGEMENT NOTE 04/08/2012  Patient:  Heidi Fleming, Heidi Fleming   Account Number:  [id number]  Date Initiated:  04/08/2012  Documentation initiated by:  Lorenda Ishihara  Subjective/Objective Assessment:   78 yo female admitted s/p Robotic TAH. PTA lived at home with family.     Action/Plan:   Home when stable.   Anticipated DC Date:  04/09/2012   Anticipated DC Plan:  HOME/SELF CARE      DC Planning Services  CM consult      Choice offered to / List presented to:             Status of service:  Completed, signed off Medicare Important Message given?   (If response is "NO", the following Medicare IM given date fields will be blank) Date Medicare IM given:   Date Additional Medicare IM given:    Discharge Disposition:  HOME W HOME HEALTH SERVICES  Per UR Regulation:  Reviewed for med. necessity/level of care/duration of stay  If discussed at Long Length of Stay Meetings, dates discussed:    Comments:

## 2012-04-08 NOTE — Progress Notes (Signed)
1 Day Post-Op Procedure(s) (LRB): ROBOTIC ASSISTED TOTAL HYSTERECTOMY WITH BILATERAL SALPINGO OOPHORECTOMY (N/A) LYMPH NODE DISSECTION (Right)  Subjective: Patient reports tolerating solid food and minimal abdominal pain.  Due to void.  Denies nausea, vomiting, passing flatus, or having a bowel movement.    Objective: Vital signs in last 24 hours: Temp:  [97.3 F (36.3 C)-98.3 F (36.8 C)] 98.3 F (36.8 C) (12/18 4098) Pulse Rate:  [62-90] 80  (12/18 0937) Resp:  [11-20] 20  (12/18 0642) BP: (102-158)/(61-95) 106/64 mmHg (12/18 0937) SpO2:  [98 %-100 %] 98 % (12/18 1191) Weight:  [177 lb (80.287 kg)] 177 lb (80.287 kg) (12/17 1518) Last BM Date: 04/06/12  Intake/Output from previous day: 12/17 0701 - 12/18 0700 In: 3431.7 [P.O.:120; I.V.:3311.7] Out: 1730 [Urine:1630; Blood:100]  Physical Examination: General: alert, cooperative and no distress Resp: clear to auscultation bilaterally Cardio: regular rate and rhythm, S1, S2 normal, no murmur, click, rub or gallop GI: soft, non-tender; bowel sounds normal; no masses,  no organomegaly and incision: lap sites with steri strips dry and intact Extremities: extremities normal, atraumatic, no cyanosis or edema  Labs: WBC/Hgb/Hct/Plts:  13.3/11.7/34.6/249 (12/18 4782) BUN/Cr/glu/ALT/AST/amyl/lip:  9/0.86/--/--/--/--/-- (12/18 9562)  Assessment: 77 y.o. s/p Procedure(s): ROBOTIC ASSISTED TOTAL HYSTERECTOMY WITH BILATERAL SALPINGO OOPHORECTOMY LYMPH NODE DISSECTION: stable Pain:  Pain is well-controlled on oral medications.  Heme:  Hgb 11.7 and Hct 34.6 this am.  CV:  History of elevated BP.  Currently taking Quinapril.  GI:  Tolerating po: Yes    FEN:  Mild hyponatremia this am: 127.  Mild hypokalemia this am: 3.4.  Prophylaxis: intermittent pneumatic compression boots.  Plan: Saline lock IV Repeat Bmet at 14:00 to reassess K+ and Na+ Plan for possible discharge later today if post-operative goals are met Continue  post-operative plan of care at this time   LOS: 1 day    CROSS, MELISSA DEAL 04/08/2012, 11:02 AM

## 2012-04-09 LAB — BASIC METABOLIC PANEL
BUN: 11 mg/dL (ref 6–23)
Creatinine, Ser: 0.88 mg/dL (ref 0.50–1.10)
GFR calc Af Amer: 71 mL/min — ABNORMAL LOW (ref >90)
GFR calc non Af Amer: 61 mL/min — ABNORMAL LOW (ref >90)
Potassium: 3.6 mEq/L (ref 3.5–5.1)

## 2012-04-09 MED ORDER — IBUPROFEN 200 MG PO TABS
200.0000 mg | ORAL_TABLET | Freq: Four times a day (QID) | ORAL | Status: DC | PRN
  Filled 2012-04-09: qty 1

## 2012-04-09 MED ORDER — ACETAMINOPHEN 325 MG PO TABS: 325.0000 mg | ORAL_TABLET | Freq: Four times a day (QID) | ORAL | Status: DC | PRN

## 2012-04-09 MED ORDER — PANTOPRAZOLE SODIUM 40 MG PO TBEC
40.0000 mg | DELAYED_RELEASE_TABLET | Freq: Every day | ORAL | Status: DC
  Administered 2012-04-09: 40 mg via ORAL
  Filled 2012-04-09: qty 1

## 2012-04-09 NOTE — Progress Notes (Signed)
Warner Mccreedy, NP aware via phone pt had voided 150 ml clear, yellow urine within last hour. NP instructed me to proceed with discharge and have pt call Dr. Tamela Oddi if any problems voiding recurred post discharge.

## 2012-04-09 NOTE — Progress Notes (Signed)
Assessment unchanged. Pt verbalized understanding of dc instructions. Introduced to My Chart yet pt reported not having a computer. No scripts at dc. Slightly nauseated when sister-in-law arrived to take pt home. Pt stated " I've been up getting ready and tired myself out." PO Zofran given prior to dc and encouraged pt to call office for on call MD for any problems including those reviewed on AVS with verbalized understanding. Pt discharged via wheelchair to front entrance accompanied by NT and sister-in-law to meet awaiting vehicle to carry home.

## 2012-04-09 NOTE — Progress Notes (Signed)
2 Days Post-Op Procedure(s) (LRB): ROBOTIC ASSISTED TOTAL HYSTERECTOMY WITH BILATERAL SALPINGO OOPHORECTOMY (N/A) LYMPH NODE DISSECTION (Right)  Subjective: Patient reports tolerating solid food and minimal abdominal pain.  She is currently due to void since foley removal this am.  Reports having a bowel movement last pm with passing a minimal amount of flatus.  No complaints voiced.  Objective: Vital signs in last 24 hours: Temp:  [97.9 F (36.6 C)-98.5 F (36.9 C)] 97.9 F (36.6 C) (12/19 0609) Pulse Rate:  [67-83] 67  (12/19 0609) Resp:  [16-18] 16  (12/19 0609) BP: (106-135)/(64-80) 116/73 mmHg (12/19 0609) SpO2:  [95 %-97 %] 96 % (12/19 0609) Last BM Date: 04/06/12  Intake/Output from previous day: 12/18 0701 - 12/19 0700 In: 300 [I.V.:300] Out: 675 [Urine:675]  Physical Examination: General: alert, cooperative and no distress Resp: clear to auscultation bilaterally Cardio: regular rate and rhythm, S1, S2 normal, no murmur, click, rub or gallop GI: soft, non-tender; bowel sounds normal; no masses,  no organomegaly and incision: lap sites with steri strips dry and intact Extremities: extremities normal, atraumatic, no cyanosis or edema  Labs:   BUN/Cr/glu/ALT/AST/amyl/lip:  11/0.88/--/--/--/--/-- (12/19 0435)  Assessment: 78 y.o. s/p Procedure(s): ROBOTIC ASSISTED TOTAL HYSTERECTOMY WITH BILATERAL SALPINGO OOPHORECTOMY LYMPH NODE DISSECTION: stable Pain:  Pain is well-controlled on oral medications.  Heme:  Hgb 11.7 and Hct 34.6 on 04/08/12.  CV:  History of elevated BP.  Currently taking Quinapril.  GI:  Tolerating po: Yes.  Bowel movement on 04/08/12.    FEN:  Mild hyponatremia on 04/08/12 of 127 with repeat that afternoon of 128: Stable.  Mild hypokalemia 04/08/12 am of 3.4 with repeat K+ of 3.5 that afternoon.  Prophylaxis: intermittent pneumatic compression boots.  Plan: Due to void Possible discharge this afternoon if able to void Continue post-operative  plan of care at this time   LOS: 2 days    CROSS, MELISSA DEAL 04/09/2012, 9:24 AM

## 2012-04-09 NOTE — Discharge Summary (Signed)
Physician Discharge Summary  Patient ID: Heidi Fleming MRN: [id number] DOB/AGE: 01-18-1931 78 y.o.  Admit date: 04/07/2012 Discharge date: 04/09/2012  Admission Diagnoses: Endometrial adenocarcinoma  Discharge Diagnoses:  Principal Problem:  *Endometrial adenocarcinoma   Discharged Condition: The patient is in good condition and stable for discharge at this time.  Hospital Course:  On 04/07/2012, the patient underwent the following: Procedure(s):  ROBOTIC ASSISTED TOTAL HYSTERECTOMY WITH BILATERAL SALPINGO OOPHORECTOMY, LYMPH NODE DISSECTION.  The postoperative course was uneventful.  She was discharged to home on postoperative day 2 tolerating a regular diet.  Consults: None  Significant Diagnostic Studies: None  Treatments: surgery: See above  Discharge Exam: Blood pressure 116/73, pulse 67, temperature 97.9 F (36.6 C), temperature source Oral, resp. rate 16, height 5\' 1"  (1.549 m), weight 177 lb (80.287 kg), SpO2 96.00%. General appearance: alert, cooperative and no distress Resp: clear to auscultation bilaterally Cardio: regular rate and rhythm, S1, S2 normal, no murmur, click, rub or gallop GI: soft, non-tender; bowel sounds normal; no masses,  no organomegaly Extremities: extremities normal, atraumatic, no cyanosis or edema Incision/Wound: Lap sites on abdomen with steri strips clean, dry, and intact  Disposition: 01-Home or Self Care  Discharge Orders    Future Appointments: Provider: Department: Dept Phone: Center:   08/28/2012 1:00 PM Sherrie George, MD [facility] [phone] None     Future Orders Please Complete By Expires   Diet - low sodium heart healthy      Increase activity slowly      Driving Restrictions      Comments:   No driving for 1 week.  Do not take narcotics and drive.   Lifting restrictions      Comments:   No lifting greater than 10 lbs.   Sexual Activity Restrictions      Comments:   No sexual activity,  nothing in the vagina, for 8 weeks.   Call MD for:  temperature >100.4      Call MD for:  persistant nausea and vomiting      Call MD for:  severe uncontrolled pain      Call MD for:  redness, tenderness, or signs of infection (pain, swelling, redness, odor or green/yellow discharge around incision site)      Call MD for:  difficulty breathing, headache or visual disturbances      Call MD for:  hives      Call MD for:  persistant dizziness or light-headedness      Call MD for:  extreme fatigue          Medication List     As of 04/09/2012  9:32 AM    TAKE these medications         ALPRAZolam 0.5 MG tablet   Commonly known as: XANAX   Take 0.5-1 mg by mouth at bedtime as needed. For sleep      calcium-vitamin D 500-200 MG-UNIT per tablet   Commonly known as: OSCAL WITH D   Take 1 tablet by mouth daily.      cholecalciferol 1000 UNITS tablet   Commonly known as: VITAMIN D   Take 1,000 Units by mouth daily.      hydrochlorothiazide 25 MG tablet   Commonly known as: HYDRODIURIL   Take 12.5 mg by mouth every morning.      HYDROcodone-acetaminophen 5-500 MG per tablet   Commonly known as: VICODIN   Take 1 tablet by mouth every 6 (six) hours as needed for pain.  ibuprofen 200 MG tablet   Commonly known as: ADVIL,MOTRIN   Take 200 mg by mouth every 6 (six) hours as needed. For pain      omeprazole 20 MG capsule   Commonly known as: PRILOSEC   Take 20 mg by mouth daily.      quinapril 20 MG tablet   Commonly known as: ACCUPRIL   Take 20 mg by mouth every morning.      vitamin C 500 MG tablet   Commonly known as: ASCORBIC ACID   Take 500 mg by mouth daily.           Follow-up Information    Please follow up. (GYN Oncology to call with final pathology results and to arrange a follow up appointment.)          Signed: CROSS, MELISSA DEAL 04/09/2012, 9:32 AM

## 2012-04-16 ENCOUNTER — Telehealth: Payer: Self-pay | Admitting: Gynecologic Oncology

## 2012-04-16 NOTE — Telephone Encounter (Signed)
Post op telephone call to check patient status.  Patient describes expected post operative status.  "Battling a cold."  Denies fever or productive cough.  Adequate PO intake reported.  Bladder functioning without difficulty.  Reporting the use of stool softeners for mild constipation.  Pain minimal.  Reportable signs and symptoms reviewed.  Follow up appt given for Feb. 5, 2014.  Instructed to call for any questions or concerns.

## 2012-04-23 ENCOUNTER — Ambulatory Visit: Payer: Medicare Other | Admitting: Gynecologic Oncology

## 2012-04-30 ENCOUNTER — Encounter: Payer: Self-pay | Admitting: Gynecologic Oncology

## 2012-04-30 ENCOUNTER — Ambulatory Visit: Payer: Medicare Other | Attending: Gynecologic Oncology | Admitting: Gynecologic Oncology

## 2012-04-30 VITALS — BP 142/82 | HR 98 | Temp 97.3°F | Resp 18 | Ht 60.24 in | Wt 177.1 lb

## 2012-04-30 DIAGNOSIS — C549 Malignant neoplasm of corpus uteri, unspecified: Secondary | ICD-10-CM | POA: Insufficient documentation

## 2012-04-30 DIAGNOSIS — C541 Malignant neoplasm of endometrium: Secondary | ICD-10-CM

## 2012-04-30 NOTE — Patient Instructions (Signed)
Follow up with Dr. Roselind Messier. Return to clinic in 6 months.

## 2012-04-30 NOTE — Progress Notes (Signed)
Consult Note: Gyn-Onc  Heidi Fleming 79 y.o. female  CC:  Chief Complaint  Patient presents with  . Endo ca    Follow up post-op    HPI: Patient is a 79 year old gravida 29 virginal female who has been menopausal for greater than 20 years. She's been having some pink spotting for the past few months with no pain associated with it. She states that many times it was just a pink discharge admixed with mucus. She's not sure why she delayed in getting care but ultimately went to see Dr. Seymour Bars and had ultrasound performed showed a thick irregular endometrium of 22 mm. On November 8 she underwent dilation and curettage hysteroscopy. Operative findings included endometrial polyps and a submucosal myoma. Pathology revealed a focal endometrioid carcinoma FIGO grade 1 arising in a background of atypical simple and complex hyperplasia. There was fragments of a leiomyoma. It is for this reason that she is referred to [place] today.  Interval History:  She underwent a total robotic hysterectomy bilateral salpingo-oophorectomy and right pelvic lymph node sampling on December 17. Operative findings included the uterus with adhesions of rectosigmoid colon with obliteration of the cul-de-sac. Bilateral ovaries were here in to the midline. She had bilateral retroperitoneal fibrosis. On frozen section showed a grade 1 lesion with less than 50% myometrial invasion on frozen section.  Final pathology revealed a grade 1 endometrioid adenocarcinoma with 60% myometrial invasion (0.6 cm out of 1 cm by mutual invasion. She had lymphovascular space involvement present. 0 out of one lymph nodes were involved. She therefore has stage IB disease. She comes in today for her postoperative check. Postoperatively she had episode of bright red clots on December 31 of self-limited and did stop. She denies any change about bladder habits. She's had no pain. Her energy is slowly and steadily improving. She states she did not fluconazole  tired today but is otherwise been resting well.   Current Meds:  Outpatient Encounter Prescriptions as of 04/30/2012  Medication Sig Dispense Refill  . ALPRAZolam (XANAX) 0.5 MG tablet Take 0.5-1 mg by mouth at bedtime as needed. For sleep      . calcium-vitamin D (OSCAL WITH D) 500-200 MG-UNIT per tablet Take 1 tablet by mouth daily.      . cholecalciferol (VITAMIN D) 1000 UNITS tablet Take 1,000 Units by mouth daily.      . hydrochlorothiazide (HYDRODIURIL) 25 MG tablet Take 12.5 mg by mouth every morning.       Marland Kitchen omeprazole (PRILOSEC) 20 MG capsule Take 20 mg by mouth daily.      . quinapril (ACCUPRIL) 20 MG tablet Take 20 mg by mouth every morning.       . vitamin C (ASCORBIC ACID) 500 MG tablet Take 500 mg by mouth daily.      Marland Kitchen HYDROcodone-acetaminophen (VICODIN) 5-500 MG per tablet Take 1 tablet by mouth every 6 (six) hours as needed for pain.  12 tablet  0  . ibuprofen (ADVIL,MOTRIN) 200 MG tablet Take 200 mg by mouth every 6 (six) hours as needed. For pain        Allergy: No Known Allergies  Social Hx:   History   Social History  . Marital Status: Single    Spouse Name: N/A    Number of Children: N/A  . Years of Education: N/A   Occupational History  . Not on file.   Social History Main Topics  . Smoking status: Former Smoker -- 1 years    Quit date:  04/23/1943  . Smokeless tobacco: Never Used  . Alcohol Use: No  . Drug Use: No  . Sexually Active: Not on file   Other Topics Concern  . Not on file   Social History Narrative  . No narrative on file    Past Surgical Hx:  Past Surgical History  Procedure Date  . Dilation and curettage of uterus   . Cataract extraction     bilateral  age 60yo  . Dilatation & currettage/hysteroscopy with resectocope 02/28/2012    Procedure: DILATATION & CURETTAGE/HYSTEROSCOPY WITH RESECTOCOPE;  Surgeon: Genia Del, MD;  Location: [facility];  Service: Gynecology;  Laterality: N/A;  Requests resectoscope with the loop.  .  Robotic assisted total hysterectomy with bilateral salpingo oopherectomy 04/07/2012    Procedure: ROBOTIC ASSISTED TOTAL HYSTERECTOMY WITH BILATERAL SALPINGO OOPHORECTOMY;  Surgeon: Rejeana Brock A. Duard Brady, MD;  Location: [facility];  Service: Gynecology;  Laterality: N/A;  . Lymph node dissection 04/07/2012    Procedure: LYMPH NODE DISSECTION;  Surgeon: Rejeana Brock A. Duard Brady, MD;  Location: [facility];  Service: Gynecology;  Laterality: Right;  pelvic    Past Medical Hx:  Past Medical History  Diagnosis Date  . Hypertension   . Sleep apnea   . GERD (gastroesophageal reflux disease)   . Endometrioid carcinoma   . Irregular uterine bleeding   . Uterine polyp   . Dysrhythmia     "irregular in the past"- off meds  . Anxiety     regarding surgery  . Arthritis   . DDD (degenerative disc disease), lumbar     Family Hx:  Family History  Problem Relation Age of Onset  . Diabetes Father   . Heart disease Sister   . Hypertension Sister   . Heart disease Brother   . Cancer Brother     Vitals:  Blood pressure 142/82, pulse 98, temperature 97.3 F (36.3 C), temperature source Oral, resp. rate 18, height 5' 0.24" (1.53 m), weight 177 lb 1.6 oz (80.332 kg).  Physical Exam: Well-nourished vulva female in no acute distress.  Abdomen: Well-healed surgical incisions. Sutures were removed from the right lower quadrant port. Abdomen is soft and nontender.  Pelvic: Normal external female genitalia. Attempt was made to perform a speculum examination with a small Peterson speculum with lubricating jelly however patient did not tolerate this. I could not visualize the vaginal apex. Unit digital pelvic examination revealed a vaginal cuff to be intact. Suture line was palpated and intact  Assessment/Plan: 79 year old gravida 0 virginal female who's been menopausal for 20 years and has stage IB grade 1 endometrial carcinoma. She did have myometrial invasion of 60% and lymphovascular space involvement. I discussed with  them about that she has about a 5% risk of having a vaginal cuff recurrence. Discuss with her that based on these criteria she would be recommended to undergo vaginal cuff brachii therapy. While I recommended to have reservations regarding whether or not she would physically be able to tolerate it secondary to that very stenotic nature of her vagina. After our discussion she would like to undergo an evaluation by Dr. Roselind Messier to see if it's possible. If it is not possible to do the cylinder treatment secondary to narrowing of the vagina she will undergo close followup. She understands the rationale for the radiation therapy. Her questions as well as those of her companion were discussed her satisfaction and she will return to see me in 6 months.  Kellianne Ek A., MD 04/30/2012, 4:40 PM

## 2012-05-13 ENCOUNTER — Encounter: Payer: Self-pay | Admitting: Radiation Oncology

## 2012-05-13 NOTE — Progress Notes (Signed)
79 year old. Female. Single. Gravida 0.  Stage 1B grade 1 endometrial carcinoma. Myometrial invasion of 60% and lymphovascular space involvement. Referred by Dr. Duard Brady for consideration of vag cuff brachytherapy.  NKDA No hx of radiation therapy No indication of a pacemaker

## 2012-05-14 ENCOUNTER — Ambulatory Visit
Admission: RE | Admit: 2012-05-14 | Discharge: 2012-05-14 | Disposition: A | Discharge status: Home / Self Care | Payer: Medicare Other | Source: Ambulatory Visit | Attending: Radiation Oncology | Admitting: Radiation Oncology

## 2012-05-14 ENCOUNTER — Encounter: Payer: Self-pay | Admitting: Radiation Oncology

## 2012-05-14 VITALS — BP 151/75 | HR 65 | Temp 97.8°F | Ht 60.0 in | Wt 178.4 lb

## 2012-05-14 DIAGNOSIS — M1990 Unspecified osteoarthritis, unspecified site: Secondary | ICD-10-CM | POA: Insufficient documentation

## 2012-05-14 DIAGNOSIS — C541 Malignant neoplasm of endometrium: Secondary | ICD-10-CM

## 2012-05-14 DIAGNOSIS — M5136 Other intervertebral disc degeneration, lumbar region: Secondary | ICD-10-CM | POA: Insufficient documentation

## 2012-05-14 DIAGNOSIS — F419 Anxiety disorder, unspecified: Secondary | ICD-10-CM | POA: Insufficient documentation

## 2012-05-14 DIAGNOSIS — Z79899 Other long term (current) drug therapy: Secondary | ICD-10-CM | POA: Insufficient documentation

## 2012-05-14 DIAGNOSIS — N926 Irregular menstruation, unspecified: Secondary | ICD-10-CM | POA: Insufficient documentation

## 2012-05-14 DIAGNOSIS — I10 Essential (primary) hypertension: Secondary | ICD-10-CM | POA: Insufficient documentation

## 2012-05-14 DIAGNOSIS — K219 Gastro-esophageal reflux disease without esophagitis: Secondary | ICD-10-CM | POA: Insufficient documentation

## 2012-05-14 DIAGNOSIS — G4730 Sleep apnea, unspecified: Secondary | ICD-10-CM | POA: Insufficient documentation

## 2012-05-14 DIAGNOSIS — C549 Malignant neoplasm of corpus uteri, unspecified: Secondary | ICD-10-CM | POA: Insufficient documentation

## 2012-05-14 DIAGNOSIS — N840 Polyp of corpus uteri: Secondary | ICD-10-CM | POA: Insufficient documentation

## 2012-05-14 NOTE — Progress Notes (Signed)
Radiation Oncology         ([phone]) [phone] ________________________________  Initial outpatient Consultation  Name: Heidi Fleming MRN: [id number]  Date: 05/14/2012  DOB: [birth year]  CC:KIM, Fayrene Fearing, MD  Thompson Caul., MD   REFERRING PHYSICIAN: Thompson Caul., MD  DIAGNOSIS: The encounter diagnosis was Endometrial adenocarcinoma. stage I-B grade 1  HISTORY OF PRESENT ILLNESS::Heidi Fleming is a 78 y.o. female who is seen out of the courtesy of Dr. Cleda Mccreedy for an opinion concerning radiation therapy as part of management of patient's recently diagnosed endometrial cancer. Last year the patient presented with intermittent vaginal spotting. she ultimately underwent endometrial resection of a uterine lesion and curettings. Pathology from this procedure revealed focal endometrioid carcinoma, FIGO grade 1. the patient was seen by Dr. Duard Brady and on December 17 underwent total robotic hysterectomy, bilateral salpingo-oophorectomy and right pelvic lymph node sampling. Pathology from the surgery revealed endometrial adenocarcinoma, FIGO grade 1. The tumor measured 4 cm in size. There was myometrial lymphovascular space invasion present and myometrial invasion to a depth of 0.6 cm and a 1 cm thick myometrium. There was no cervical stromal involvement. Lymph node from the right pelvis area revealed no evidence of metastatic spread. Patient has been doing well since her surgery. She is now seen in radiation oncology to be evaluated for potential adjuvant treatment.Marland Kitchen  PREVIOUS RADIATION THERAPY: No  PAST MEDICAL HISTORY:  has a past medical history of Hypertension; Sleep apnea; GERD (gastroesophageal reflux disease); Endometrioid carcinoma (02/28/12); Irregular uterine bleeding; Uterine polyp; Dysrhythmia; Anxiety; Arthritis; DDD (degenerative disc disease), lumbar; and Cancer.    PAST SURGICAL HISTORY: Past Surgical History  Procedure Date  . Dilation and curettage of uterus   . Cataract extraction       bilateral  age 55yo  . Dilatation & currettage/hysteroscopy with resectocope 02/28/2012    Procedure: DILATATION & CURETTAGE/HYSTEROSCOPY WITH RESECTOCOPE;  Surgeon: Genia Del, MD;  Location: [facility];  Service: Gynecology;  Laterality: N/A;  Requests resectoscope with the loop.  . Robotic assisted total hysterectomy with bilateral salpingo oopherectomy 04/07/2012    Procedure: ROBOTIC ASSISTED TOTAL HYSTERECTOMY WITH BILATERAL SALPINGO OOPHORECTOMY;  Surgeon: Rejeana Brock A. Duard Brady, MD;  Location: [facility];  Service: Gynecology;  Laterality: N/A;  . Lymph node dissection 04/07/2012    Procedure: LYMPH NODE DISSECTION;  Surgeon: Rejeana Brock A. Duard Brady, MD;  Location: [facility];  Service: Gynecology;  Laterality: Right;  pelvic    FAMILY HISTORY: family history includes Cancer in her brother; Diabetes in her father; Heart disease in her brother and sister; and Hypertension in her sister.  SOCIAL HISTORY:  reports that she quit smoking about 69 years ago. She has never used smokeless tobacco. She reports that she does not drink alcohol or use illicit drugs. 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 ALPRAZolam (XANAX) 0.5 MG tablet Take 0.5-1 mg by mouth at bedtime as needed. For sleep      . calcium-vitamin D (OSCAL WITH D) 500-200 MG-UNIT per tablet Take 1 tablet by mouth daily.      . cholecalciferol (VITAMIN D) 1000 UNITS tablet Take 1,000 Units by mouth daily.      . hydrochlorothiazide (HYDRODIURIL) 25 MG tablet Take 12.5 mg by mouth every morning.       Marland Kitchen HYDROcodone-acetaminophen (VICODIN) 5-500 MG per tablet Take 1 tablet by mouth every 6 (six) hours as needed for pain.  12 tablet  0  . ibuprofen (ADVIL,MOTRIN) 200 MG tablet Take 200 mg by mouth every 6 (six) hours as needed. For pain      . omeprazole (PRILOSEC) 20 MG capsule Take 20 mg by mouth daily.       . quinapril (ACCUPRIL) 20 MG tablet Take 20 mg by mouth every morning.       . vitamin C (ASCORBIC ACID) 500 MG tablet Take 500 mg by mouth daily.        REVIEW OF SYSTEMS:  A 15 point review of systems is documented in the electronic medical record. This was obtained by the nursing staff. However, I reviewed this with the patient to discuss relevant findings and make appropriate changes.  She denies any vaginal drainage or bleeding at this time. She denies any urination difficulties or bowel complaints. She denies any problems with abdominal pain or appetite problems or swelling in her lower extremities.   PHYSICAL EXAM:  height is 5' (1.524 m) and weight is 178 lb 6.4 oz (80.922 kg). Her temperature is 97.8 F (36.6 C). Her blood pressure is 151/75 and her pulse is 65.   BP 151/75  Pulse 65  Temp 97.8 F (36.6 C)  Ht 5' (1.524 m)  Wt 178 lb 6.4 oz (80.922 kg)  BMI 34.84 kg/m2  General Appearance:    Alert, cooperative, no distress, appears stated age  Head:    Normocephalic, without obvious abnormality, atraumatic  Eyes  pupils equal round and reactive to light. The extraocular eye movements are intact.        Nose:   Nares normal, septum midline, mucosa normal, no drainage    or sinus tenderness  Throat:   Lips, mucosa, and tongue normal; teeth and gums normal  Neck:   Supple, symmetrical, trachea midline, no adenopathy;    thyroid:  no enlargement/tenderness/nodules; no carotid   bruit or JVD  Back:     Symmetric, no curvature, ROM normal, no CVA tenderness  Lungs:     Clear to auscultation bilaterally, respirations unlabored  Chest Wall:    No tenderness or deformity   Heart:    Regular rate and rhythm,         Abdomen:     Soft, non-tender, bowel sounds active all four quadrants,    no masses, no organomegaly, 4 small scars which are well healed from the patient's laparoscopic procedure   Genitalia:    the external genitalia are unremarkable. The vaginal vault is stenotic and  only allowed index finger digital examination. I wasn't able to place the small speculum. With palpation the vaginal cuff appeared to be well-healed. Sutures were palpable.      Extremities:   Extremities normal, atraumatic, no cyanosis or edema  Pulses:   2+ and symmetric all extremities  Skin:   Skin color, texture, turgor normal, no rashes or lesions  Lymph nodes:   Cervical, supraclavicular, and axillary nodes normal  Neurologic:   CNII-XII intact, normal strength, sensation and reflexes    throughout    LABORATORY DATA:  Lab Results  Component Value Date   WBC 13.3* 04/08/2012   HGB 11.7* 04/08/2012   HCT 34.6* 04/08/2012   MCV 81.6 04/08/2012   PLT 249 04/08/2012   Lab Results  Component Value Date   NA 128* 04/09/2012   K 3.6 04/09/2012   CL 95* 04/09/2012   CO2 28 04/09/2012   Lab Results  Component Value Date   ALT 13 04/03/2012  AST 20 04/03/2012   ALKPHOS 97 04/03/2012   BILITOT 0.6 04/03/2012     RADIOGRAPHY: No results found.    IMPRESSION: Stage I-B grade 1 endometrioid adenocarcinoma. Patient would be at risk for vaginal cuff recurrence and I would recommend intracavitary brachytherapy treatments as part of her overall management. With careful placement I do feel I would be able to insert the 2 cm vaginal cylinder for iridium 192 high dose rate treatments.  PLAN: The patient is undecided whether she will proceed with intracavitary brachytherapy treatments as part of her overall management. I have asked her to call me within a week with her final decision.  If she does not proceed with adjuvant radiation therapy, the patient understands that she will need to be seen 6 months from now in gynecologic oncology for exam and Pap smear.  I spent 60 minutes minutes face to face with the patient and more than 50% of that time was spent in counseling and/or coordination of care.    ------------------------------------------------  -----------------------------------  Billie Lade, PhD, MD

## 2012-05-14 NOTE — Progress Notes (Signed)
Please see the Nurse Progress Note in the MD Initial Consult Encounter for this patient. 

## 2012-05-14 NOTE — Progress Notes (Signed)
Here for assessment following Total Hysterectomy with BSO.  Reports pain as a pinching sensation in the incisional region.  Denies any dysria or frequency, nor rectal irritation.  Reports improvement of fatigue post surgery and states "good" appetite.

## 2012-05-15 NOTE — Addendum Note (Signed)
Encounter addended by: Glennie Hawk, RN on: 05/15/2012 10:01 AM<BR>     Documentation filed: Charges VN

## 2012-05-19 ENCOUNTER — Telehealth: Payer: Self-pay | Admitting: Radiation Oncology

## 2012-05-19 NOTE — Telephone Encounter (Signed)
Phoned patient as requested by Dr. Roselind Messier to inquire if she would like to proceed with HDR treatments. No answer. Left message requesting return call.

## 2012-05-20 ENCOUNTER — Telehealth: Payer: Self-pay | Admitting: Radiation Oncology

## 2012-05-20 NOTE — Telephone Encounter (Signed)
Phoned patient as requested by Dr. Roselind Messier to inquire if she has decided to proceed with HDR treatment. Patient reports that she is "still undecided." This Clinical research associate asked if she could call again in a week or so to check in with her and the patient approved. Patient assures this Clinical research associate if she makes a decision soon she will contact our staff. Provided patient with contact information. Patient verbalized understanding of all reviewed and discussed. Patient expressed appreciation for the call.

## 2012-05-21 ENCOUNTER — Other Ambulatory Visit: Payer: Self-pay | Admitting: Obstetrics & Gynecology

## 2012-05-21 DIAGNOSIS — N644 Mastodynia: Secondary | ICD-10-CM

## 2012-05-21 DIAGNOSIS — Z1231 Encounter for screening mammogram for malignant neoplasm of breast: Secondary | ICD-10-CM

## 2012-05-27 ENCOUNTER — Ambulatory Visit: Payer: Medicare Other | Admitting: Gynecologic Oncology

## 2012-06-02 ENCOUNTER — Telehealth: Payer: Self-pay | Admitting: *Deleted

## 2012-06-02 NOTE — Telephone Encounter (Signed)
CALLED PATIENT TO INFORM OF HDR VAG. CUFF CASE, LVM FOR A RETURN CALL 

## 2012-06-11 ENCOUNTER — Other Ambulatory Visit: Payer: Self-pay | Admitting: Obstetrics & Gynecology

## 2012-06-11 ENCOUNTER — Ambulatory Visit
Admission: RE | Admit: 2012-06-11 | Discharge: 2012-06-11 | Disposition: A | Discharge status: Home / Self Care | Payer: Medicare Other | Source: Ambulatory Visit | Attending: Obstetrics & Gynecology | Admitting: Obstetrics & Gynecology

## 2012-06-11 DIAGNOSIS — N644 Mastodynia: Secondary | ICD-10-CM

## 2012-06-16 ENCOUNTER — Telehealth: Payer: Self-pay | Admitting: *Deleted

## 2012-06-16 NOTE — Telephone Encounter (Signed)
CALLED PATIENT TO REMIND OF APPTS. FOR 06-17-12, SPOKE WITH PATIENT AND SHE IS AWARE OF THESE APPTS.

## 2012-06-17 ENCOUNTER — Ambulatory Visit
Admission: RE | Admit: 2012-06-17 | Discharge: 2012-06-17 | Disposition: A | Discharge status: Home / Self Care | Payer: Medicare Other | Source: Ambulatory Visit | Attending: Radiation Oncology | Admitting: Radiation Oncology

## 2012-06-17 ENCOUNTER — Encounter: Payer: Self-pay | Admitting: Radiation Oncology

## 2012-06-17 VITALS — BP 149/70 | HR 67 | Temp 97.5°F | Resp 18 | Ht 61.0 in | Wt 179.6 lb

## 2012-06-17 DIAGNOSIS — C541 Malignant neoplasm of endometrium: Secondary | ICD-10-CM

## 2012-06-17 DIAGNOSIS — C549 Malignant neoplasm of corpus uteri, unspecified: Secondary | ICD-10-CM | POA: Insufficient documentation

## 2012-06-17 DIAGNOSIS — Z510 Encounter for antineoplastic radiation therapy: Secondary | ICD-10-CM | POA: Insufficient documentation

## 2012-06-17 MED ORDER — LORAZEPAM 0.5 MG PO TABS
0.5000 mg | ORAL_TABLET | Freq: Once | ORAL | Status: AC
  Administered 2012-06-17: 0.5 mg via SUBLINGUAL
  Filled 2012-06-17: qty 1

## 2012-06-17 NOTE — Progress Notes (Signed)
   Department of Radiation Oncology  Phone:  [phone] Fax:        [phone]  Simulation note  The patient was transferred to the CT simulation suite. Her vaginal cylinder was reinserted. This was affixed to the CT/MR stabilization plate to prevent slippage. Patient then proceeded to undergo CT scan in the treatment position. The vaginal cylinder was in good position. Rectum and bladder were outlined for planning purposes.  Treatment  Plan   the patient will proceed with planning for her high-dose rate treatments. She will receive 5 weekly treatments with a prescription of 6 Gy to the proximal vaginal mucosal surface. Patient will be treated with a 2 cm diameter cylinder with a treatment length of 3 cm. Cumulative brachytherapy dose will be 30 gray to the proximal vagina.  -----------------------------------  Billie Lade, PhD, MD

## 2012-06-17 NOTE — Progress Notes (Signed)
   Department of Radiation Oncology  Phone:  [phone] Fax:        [phone]  Vaginal brachii therapy procedure note  The patient was taken to the nursing suite. She was placed in the dorsolithotomy position on the examination table. A pelvic exam was performed. The vaginal cuff is well-healed.  The patient proceeded to undergo fitting for her vaginal cylinder. In light of the narrowed vaginal vault only a 2 cm diameter cylinder could be used for treatment. she will be treated with four 2.0 CM rings.  The patient tolerated the procedure well.    Simple treatment device note  Patient had contruction of her custom vaginal cylinder for treatment. The patient will be treated with 4 2.0 cm diameter rings.  -----------------------------------  Billie Lade, PhD, MD

## 2012-06-17 NOTE — Progress Notes (Addendum)
Patient presented to the clinic today accompanied by her sister in law for vaginal cuff fitting and initial HDR treatment. Patient alert and oriented to person, place, and time. No distress noted. Steady gait noted. Pleasant affect noted. Patient denies pain at this time. Patient denies vaginal discharge or bleeding for the last two weeks since surgery. Patient denies burning with urination. Patient denies nausea, vomiting, headache or dizziness. Patient reports great anxiety and fear associated reference today's events. Informed Dr. Roselind Messier of these findings. Administered Ativan 0.5 mg SL as directed by Dr. Roselind Messier. Assisted Dr. Roselind Messier with vag cuff fitting. Patient tolerated well. Bethann Goo, RT escorted patient to simulation.

## 2012-06-17 NOTE — Progress Notes (Signed)
See progress note under physician encounter. 

## 2012-06-17 NOTE — Progress Notes (Signed)
   Department of Radiation Oncology  Phone:  [phone] Fax:        [phone]  High-dose-rate brachii therapy procedure note    Verification simulation note  After planning was complete today the patient was transferred to the high dose rate suite. Her custom vaginal cylinder was reinserted. This was affixed to the CT/MR stabilization plate to prevent slippage. A fiducial marker was placed within the vaginal cylinder. Patient she did undergo an x-ray, AP and right lateral. These x-rays confirmed accurate position of the vaginal cylinder for treatment.   High-dose-rate brachii therapy procedure  The vaginal cylinder was affixed to the high-dose-rate afterloader using a catheter system. Patient proceeded to undergo her first high-dose-rate treatment. She was prescribed a dose of 6 Gy to the vaginal proximal mucosal surface. This was achieved with a total dwell time of 202.9 seconds. Patient was treated with 7 dwell positions using 1 channel. Iridium 192 was the high-dose-rate source. The patient tolerated the procedure well. After completion of her therapy a radiation survey was performed documenting return of the iridium source into the Nucletron safe.  -----------------------------------  Billie Lade, PhD, MD

## 2012-06-23 ENCOUNTER — Telehealth: Payer: Self-pay | Admitting: *Deleted

## 2012-06-23 NOTE — Telephone Encounter (Signed)
CALLED PATIENT TO REMIND OF HDR TX. FOR 06-24-12, CONFIRMED APPT. WITH PATIENT

## 2012-06-24 ENCOUNTER — Ambulatory Visit
Admission: RE | Admit: 2012-06-24 | Discharge: 2012-06-24 | Disposition: A | Discharge status: Home / Self Care | Payer: Medicare Other | Source: Ambulatory Visit | Attending: Radiation Oncology | Admitting: Radiation Oncology

## 2012-06-24 DIAGNOSIS — C541 Malignant neoplasm of endometrium: Secondary | ICD-10-CM

## 2012-06-24 NOTE — Progress Notes (Signed)
   Department of Radiation Oncology  Phone:  [phone] Fax:        [phone]  High-dose-rate brachii therapy procedure note   Vaginal brachii therapy procedure note  The patient was taken to the HDR suite. She was placed in the dorsolithotomy position on the examination table. A pelvic exam was performed. The vaginal cuff is well-healed. The patient proceeded to undergo fitting for her vaginal cylinder. In light of the narrowed vaginal vault only a 2 cm diameter cylinder could be used for treatment. she will be treated with four 2.0 CM rings. The vaginal cylinder was affixed to the CT/MR stabilization plate to prevent slippage. The patient tolerated the procedure well.    Simple treatment device note  Patient had contruction of her custom vaginal cylinder for treatment. The patient will be treated with 4 2.0 cm diameter rings.   Verification simulation note   A fiducial marker was placed within the vaginal cylinder. Patient she did undergo an x-ray, AP and right lateral. These x-rays confirmed accurate position of the vaginal cylinder for treatment.   High-dose-rate brachii therapy procedure  The vaginal cylinder was affixed to the high-dose-rate afterloader using a catheter system. Patient proceeded to undergo her second high-dose-rate treatment. She was prescribed a dose of 6 Gy to the vaginal proximal mucosal surface. This was achieved with a total dwell time of 216.9 seconds. Patient was treated with 7 dwell positions using 1 channel. Iridium 192 was the high-dose-rate source. The patient tolerated the procedure well. After completion of her therapy a radiation survey was performed documenting return of the iridium source into the Nucletron safe.  -----------------------------------  Billie Lade, PhD, MD

## 2012-07-01 ENCOUNTER — Telehealth: Payer: Self-pay | Admitting: Radiation Oncology

## 2012-07-01 ENCOUNTER — Ambulatory Visit
Admission: RE | Admit: 2012-07-01 | Discharge: 2012-07-01 | Disposition: A | Discharge status: Home / Self Care | Payer: Medicare Other | Source: Ambulatory Visit | Attending: Radiation Oncology | Admitting: Radiation Oncology

## 2012-07-01 DIAGNOSIS — C541 Malignant neoplasm of endometrium: Secondary | ICD-10-CM

## 2012-07-01 NOTE — Telephone Encounter (Signed)
Per Dr. Trina Ao request phoned patient and requested she present at 1045 for 1100 HDR treatment instead of 1300. Patient verbalized that she would. Dr. Roselind Messier and Polly Cobia, RT of CT/SIM aware.

## 2012-07-01 NOTE — Progress Notes (Signed)
  Department of Radiation Oncology  Phone: [phone]  Fax: [phone]   High-dose-rate brachii therapy procedure note   Vaginal brachii therapy procedure note  The patient was taken to the HDR suite. She was placed in the dorsolithotomy position on the examination table. A pelvic exam was performed. The vaginal cuff is well-healed. The patient proceeded to undergo fitting for her vaginal cylinder. In light of the narrowed vaginal vault only a 2 cm diameter cylinder could be used for treatment. she will be treated with four 2.0 CM rings. The vaginal cylinder was affixed to the CT/MR stabilization plate to prevent slippage. The patient tolerated the procedure well.   Simple treatment device note   Patient had contruction of her custom vaginal cylinder for treatment. The patient will be treated with 4 2.0 cm diameter rings.   Verification simulation note   A fiducial marker was placed within the vaginal cylinder. Patient she did undergo an x-ray, AP and right lateral. These x-rays confirmed accurate position of the vaginal cylinder for treatment.   High-dose-rate brachii therapy procedure   The vaginal cylinder was affixed to the high-dose-rate afterloader using a catheter system. Patient proceeded to undergo her third high-dose-rate treatment. She was prescribed a dose of 6 Gy to the vaginal proximal mucosal surface. This was achieved with a total dwell time of 231.3 seconds. Patient was treated with 7 dwell positions using 1 channel. Iridium 192 was the high-dose-rate source. The patient tolerated the procedure well. After completion of her therapy a radiation survey was performed documenting return of the iridium source into the Nucletron safe.  -----------------------------------   Billie Lade, PhD, MD

## 2012-07-08 ENCOUNTER — Telehealth: Payer: Self-pay | Admitting: *Deleted

## 2012-07-08 NOTE — Telephone Encounter (Signed)
Called patient to remind of HDR Tx. For 07-09-12, confirmed appt. With patient.

## 2012-07-09 ENCOUNTER — Ambulatory Visit
Admission: RE | Admit: 2012-07-09 | Discharge: 2012-07-09 | Disposition: A | Discharge status: Home / Self Care | Payer: Medicare Other | Source: Ambulatory Visit | Attending: Radiation Oncology | Admitting: Radiation Oncology

## 2012-07-09 DIAGNOSIS — C541 Malignant neoplasm of endometrium: Secondary | ICD-10-CM

## 2012-07-09 NOTE — Progress Notes (Signed)
      Department of Radiation Oncology  Phone: [phone]  Fax: [phone]  High-dose-rate brachii therapy procedure note   Vaginal brachii therapy procedure note   The patient was taken to the HDR suite. She was placed in the dorsolithotomy position on the examination table. A pelvic exam was performed. The vaginal cuff is well-healed. The patient proceeded to undergo fitting for her vaginal cylinder. In light of the narrowed vaginal vault only a 2 cm diameter cylinder could be used for treatment. she will be treated with four 2.0 CM rings. The vaginal cylinder was affixed to the CT/MR stabilization plate to prevent slippage. The patient tolerated the procedure well.   Simple treatment device note   Patient had contruction of her custom vaginal cylinder for treatment. The patient will be treated with 4 2.0 cm diameter rings.   Verification simulation note   A fiducial marker was placed within the vaginal cylinder. Patient she did undergo an x-ray, AP and right lateral. These x-rays confirmed accurate position of the vaginal cylinder for treatment.   High-dose-rate brachii therapy procedure   The vaginal cylinder was affixed to the high-dose-rate afterloader using a catheter system. Patient proceeded to undergo her fourth high-dose-rate treatment. She was prescribed a dose of 6 Gy to the vaginal proximal mucosal surface. This was achieved with a total dwell time of 98.5 seconds. Patient was treated with 7 dwell positions using 1 channel. Iridium 192 was the high-dose-rate source. The patient tolerated the procedure well. After completion of her therapy a radiation survey was performed documenting return of the iridium source into the Nucletron safe. The patient's treatment time was shorter than last week as a new high-dose-rate source was placed prior to the fourth treatment. -----------------------------------   Billie Lade, PhD, MD

## 2012-07-14 ENCOUNTER — Telehealth: Payer: Self-pay | Admitting: *Deleted

## 2012-07-14 NOTE — Telephone Encounter (Signed)
Called patient to remind of HDR Tx. For 07-15-12, confirmed appt. With patient.

## 2012-07-15 ENCOUNTER — Telehealth: Payer: Self-pay | Admitting: *Deleted

## 2012-07-15 ENCOUNTER — Ambulatory Visit: Payer: Medicare Other | Admitting: Radiation Oncology

## 2012-07-15 NOTE — Telephone Encounter (Signed)
callled patient to alter HDR Tx. For 07-15-12, moved to 07-16-12 at 2:00 pm, spoke with patient and she is good with this, appt. Move per Dr. Roselind Messier

## 2012-07-16 ENCOUNTER — Ambulatory Visit
Admission: RE | Admit: 2012-07-16 | Discharge: 2012-07-16 | Disposition: A | Discharge status: Home / Self Care | Payer: Medicare Other | Source: Ambulatory Visit | Attending: Radiation Oncology | Admitting: Radiation Oncology

## 2012-07-16 DIAGNOSIS — IMO0002 Reserved for concepts with insufficient information to code with codable children: Secondary | ICD-10-CM

## 2012-07-16 NOTE — Progress Notes (Signed)
          Department of Radiation Oncology  Phone: [phone]  Fax: [phone]   High-dose-rate brachii therapy procedure note   Vaginal brachii therapy procedure note   The patient was taken to the HDR suite. She was placed in the dorsolithotomy position on the examination table. A pelvic exam was performed. The vaginal cuff is well-healed. The patient proceeded to undergo fitting for her vaginal cylinder. In light of the narrowed vaginal vault only a 2 cm diameter cylinder could be used for treatment. she will be treated with four 2.0 CM rings. The vaginal cylinder was affixed to the CT/MR stabilization plate to prevent slippage. The patient tolerated the procedure well.   Simple treatment device note   Patient had contruction of her custom vaginal cylinder for treatment. The patient will be treated with 4 2.0 cm diameter rings.   Verification simulation note   A fiducial marker was placed within the vaginal cylinder. Patient she did undergo an x-ray, AP and right lateral. These x-rays confirmed accurate position of the vaginal cylinder for treatment.   High-dose-rate brachii therapy procedure   The vaginal cylinder was affixed to the high-dose-rate afterloader using a catheter system. Patient proceeded to undergo her fifth high-dose-rate treatment. She was prescribed a dose of 6 Gy to the vaginal proximal mucosal surface. This was achieved with a total dwell time of 105.1 seconds. Patient was treated with 7 dwell positions using 1 channel. Iridium 192 was the high-dose-rate source. The patient tolerated the procedure well. After completion of her therapy a radiation survey was performed documenting return of the iridium source into the Nucletron safe. The patient's treatment time was shorter than last week as a new high-dose-rate source was placed prior to the fourth treatment.  -----------------------------------   Billie Lade, PhD, MD

## 2012-07-25 ENCOUNTER — Encounter: Payer: Self-pay | Admitting: Radiation Oncology

## 2012-07-25 NOTE — Progress Notes (Signed)
  Radiation Oncology         [phone] ________________________________  Name: Heidi Fleming MRN: [id number]  Date: 07/25/2012  DOB: Feb 19, 1931  End of Treatment Note  Diagnosis:  Endometrial adenocarcinoma, stage I-B grade 1   Indication for treatment:  Postop with risk for vaginal cuff recurrence       Radiation treatment dates:   Feb. 26, March 5, on March 12, March 20, July 16, 2012  Site/dose:   Proximal vagina, 30 Gy in 5 fractions  Beams/energy:   The patient was treated with the high-dose-rate applicator, iridium 192 as the high-dose-rate source, the patient was treated with a 2 cm diameter cylinder, treatment length of 3 cm, prescription was to the vaginal mucosal surface  Narrative: The patient tolerated radiation treatment relatively well.   She had some mild discomfort with the vaginal cylinder insertion.   Plan: The patient has completed radiation treatment. The patient will return to radiation oncology clinic for routine followup in one month. I advised them to call or return sooner if they have any questions or concerns related to their recovery or treatment.  -----------------------------------  Billie Lade, PhD, MD

## 2012-08-17 ENCOUNTER — Encounter: Payer: Self-pay | Admitting: Radiation Oncology

## 2012-08-17 ENCOUNTER — Ambulatory Visit
Admission: RE | Admit: 2012-08-17 | Discharge: 2012-08-17 | Disposition: A | Discharge status: Home / Self Care | Payer: Medicare Other | Source: Ambulatory Visit | Attending: Radiation Oncology | Admitting: Radiation Oncology

## 2012-08-17 VITALS — BP 134/65 | HR 64 | Temp 97.8°F | Wt 182.5 lb

## 2012-08-17 DIAGNOSIS — C541 Malignant neoplasm of endometrium: Secondary | ICD-10-CM

## 2012-08-17 NOTE — Progress Notes (Signed)
  Radiation Oncology         [phone] ________________________________  Name: Heidi Fleming MRN: [id number]  Date: 08/17/2012  DOB: 11/09/1930  Follow-Up Visit Note  CC: Pearson Grippe, MD  Thompson Caul., MD  Diagnosis:   Endometrial adenocarcinoma, stage IB, grade 1  Interval Since Last Radiation:  4  weeks  Narrative:  The patient returns today for routine follow-up.  She is doing well and without complaints. She denies any vaginal bleeding, urinary difficulties or discomfort or rectal discomfort.                              ALLERGIES:  is allergic to latex.  Meds: Current Outpatient Prescriptions  Medication Sig Dispense Refill  . ALPRAZolam (XANAX) 0.5 MG tablet Take 0.5-1 mg by mouth at bedtime as needed. For sleep      . calcium-vitamin D (OSCAL WITH D) 500-200 MG-UNIT per tablet Take 1 tablet by mouth daily.      . cholecalciferol (VITAMIN D) 1000 UNITS tablet Take 1,000 Units by mouth daily.      . hydrochlorothiazide (HYDRODIURIL) 25 MG tablet Take 12.5 mg by mouth every morning.       Marland Kitchen HYDROcodone-acetaminophen (VICODIN) 5-500 MG per tablet Take 1 tablet by mouth every 6 (six) hours as needed for pain.  12 tablet  0  . ibuprofen (ADVIL,MOTRIN) 200 MG tablet Take 200 mg by mouth every 6 (six) hours as needed. For pain      . omeprazole (PRILOSEC) 20 MG capsule Take 20 mg by mouth daily.      . quinapril (ACCUPRIL) 20 MG tablet Take 20 mg by mouth every morning.       . vitamin C (ASCORBIC ACID) 500 MG tablet Take 500 mg by mouth daily.       No current facility-administered medications for this encounter.    Physical Findings: The patient is in no acute distress. Patient is alert and oriented.  weight is 182 lb 8 oz (82.781 kg). Her temperature is 97.8 F (36.6 C). Her blood pressure is 134/65 and her pulse is 64. Her oxygen saturation is 100%. .  No palpable supraclavicular adenopathy. The lungs are clear to auscultation. The heart has regular rhythm and rate. The  abdomen is soft and nontender with normal bowel sounds.    Impression:  The patient is recovering from the effects of radiation.  No problems at this time.  Plan:  Routine followup in radiation oncology in 5 months. The patient will be seen in gynecologic oncology in 2 months for her first pelvic exam and Pap smear after completion of radiation therapy.  _____________________________________  -----------------------------------  Billie Lade, PhD, MD

## 2012-08-17 NOTE — Progress Notes (Signed)
Patient here for one month follow up completion of radiation for endometrial adenocarcinoma.Completed 30 Gy in 5 fractions.Has frequency of urination but no urgency.Bowels unchanged.Sometimes has constipation but this is normal for her.Denies pain.No vaginal bleeding or discharge.

## 2012-08-28 ENCOUNTER — Ambulatory Visit (INDEPENDENT_AMBULATORY_CARE_PROVIDER_SITE_OTHER): Payer: PRIVATE HEALTH INSURANCE | Admitting: Ophthalmology

## 2012-09-10 ENCOUNTER — Telehealth: Payer: Self-pay | Admitting: *Deleted

## 2012-09-10 NOTE — Telephone Encounter (Signed)
Returned vm from pt who states she was using her vaginal dilator 3 x weekly but then forgot to use it for > 1 week. She states the next time she used it she had bleeding. She states "it took several times washing with a cloth to get cleaned up." She states she did not have to wear a pad afterwards; there was no more bleeding. Pt concerned about the bleeding. Advised pt this RN will discuss w/Dr Roselind Messier and call her back today w/his response. Pt verbalized agreement, understanding.

## 2012-09-10 NOTE — Telephone Encounter (Signed)
Spoke w/Dr Roselind Messier who states if pt begins to have bleeding again w/regular use of dilator she should call back and be seen for FU. Called pt and informed her of Dr Trina Ao response. Advised she continue to use dilator regularly.  Pt verbalized understanding.

## 2012-10-29 ENCOUNTER — Encounter: Payer: Self-pay | Admitting: Gynecologic Oncology

## 2012-10-29 ENCOUNTER — Ambulatory Visit: Payer: Medicare Other | Attending: Gynecologic Oncology | Admitting: Gynecologic Oncology

## 2012-10-29 VITALS — BP 142/80 | HR 68 | Temp 98.2°F | Resp 18 | Ht 60.0 in | Wt 182.8 lb

## 2012-10-29 DIAGNOSIS — Z9079 Acquired absence of other genital organ(s): Secondary | ICD-10-CM | POA: Insufficient documentation

## 2012-10-29 DIAGNOSIS — I10 Essential (primary) hypertension: Secondary | ICD-10-CM | POA: Insufficient documentation

## 2012-10-29 DIAGNOSIS — Z923 Personal history of irradiation: Secondary | ICD-10-CM | POA: Insufficient documentation

## 2012-10-29 DIAGNOSIS — Z90710 Acquired absence of both cervix and uterus: Secondary | ICD-10-CM | POA: Insufficient documentation

## 2012-10-29 DIAGNOSIS — C541 Malignant neoplasm of endometrium: Secondary | ICD-10-CM

## 2012-10-29 DIAGNOSIS — C549 Malignant neoplasm of corpus uteri, unspecified: Secondary | ICD-10-CM | POA: Insufficient documentation

## 2012-10-29 NOTE — Progress Notes (Signed)
Consult Note: Gyn-Onc  Heidi Fleming 79 y.o. female  CC:  Chief Complaint  Patient presents with  . Endo ca    Follow-up visit     HPI: Patient is a 79 year old gravida 26 virginal female who has been menopausal for greater than 20 years. She's been having some pink spotting for the past few months with no pain associated with it. She states that many times it was just a pink discharge admixed with mucus. She's not sure why she delayed in getting care but ultimately went to see Dr. Seymour Bars and had ultrasound performed showed a thick irregular endometrium of 22 mm. On November 8 she underwent dilation and curettage hysteroscopy. Operative findings included endometrial polyps and a submucosal myoma. Pathology revealed a focal endometrioid carcinoma FIGO grade 1 arising in a background of atypical simple and complex hyperplasia. There was fragments of a leiomyoma.   She underwent a total robotic hysterectomy bilateral salpingo-oophorectomy and right pelvic lymph node sampling on March 22, 2012. Operative findings included the uterus with adhesions of rectosigmoid colon with obliteration of the cul-de-sac. Bilateral ovaries were here in to the midline. She had bilateral retroperitoneal fibrosis. On frozen section showed a grade 1 lesion with less than 50% myometrial invasion on frozen section.  Final pathology revealed a grade 1 endometrioid adenocarcinoma with 60% myometrial invasion (0.6 cm out of 1 cm by mutual invasion. She had lymphovascular space involvement present. 0 out of one lymph nodes were involved. She therefore has stage IB disease.   She completed vaginal cuff brachytherapy under the care of Dr. Roselind Messier in March of 2014.  Interval History:  None Review of Systems:  Constitutional: Feels occasionally low energy during the day. Is able to take care of her house and is "getting by". Denies fever. Skin: No rash, sores, jaundice, itching, or dryness.  Cardiovascular: No chest pain,  shortness of breath, or edema  Pulmonary: No cough or wheeze.  Gastro Intestinal: No nausea, vomiting, constipation, or diarrhea reported. No bright red blood per rectum or change in bowel movement.  Genitourinary: No frequency, urgency, or dysuria.  Denies vaginal bleeding and discharge. She is using her vaginal dilator 3 times a week for 10 minutes at a time. She did have 1 episode of spotting for which she called Dr. Roselind Messier, it has not recurred. Musculoskeletal: No myalgia, arthralgia, joint swelling or pain. She does complain of some low back pain to arthritis. She previously was seeing a chiropractor and needs to get back to see him. Neurologic: No weakness, numbness, or change in gait.  Psychology: Sleeping well   Current Meds:  Outpatient Encounter Prescriptions as of 10/29/2012  Medication Sig Dispense Refill  . ALPRAZolam (XANAX) 0.5 MG tablet Take 0.5-1 mg by mouth at bedtime as needed. For sleep      . calcium-vitamin D (OSCAL WITH D) 500-200 MG-UNIT per tablet Take 1 tablet by mouth daily.      . cholecalciferol (VITAMIN D) 1000 UNITS tablet Take 1,000 Units by mouth daily.      . hydrochlorothiazide (HYDRODIURIL) 25 MG tablet Take 12.5 mg by mouth every morning.       Marland Kitchen HYDROcodone-acetaminophen (VICODIN) 5-500 MG per tablet Take 1 tablet by mouth every 6 (six) hours as needed for pain.  12 tablet  0  . ibuprofen (ADVIL,MOTRIN) 200 MG tablet Take 200 mg by mouth every 6 (six) hours as needed. For pain      . quinapril (ACCUPRIL) 20 MG tablet Take 20 mg by  mouth every morning.       . vitamin C (ASCORBIC ACID) 500 MG tablet Take 500 mg by mouth daily.      Marland Kitchen omeprazole (PRILOSEC) 20 MG capsule Take 20 mg by mouth daily.       No facility-administered encounter medications on file as of 10/29/2012.    Allergy:  Allergies  Allergen Reactions  . Latex Itching    Social Hx:   History   Social History  . Marital Status: Single    Spouse Name: N/A    Number of Children: N/A   . Years of Education: N/A   Occupational History  . Not on file.   Social History Main Topics  . Smoking status: Former Smoker -- 1 years    Quit date: 04/23/1943  . Smokeless tobacco: Never Used  . Alcohol Use: No  . Drug Use: No  . Sexually Active: Not on file   Other Topics Concern  . Not on file   Social History Narrative  . No narrative on file    Past Surgical Hx:  Past Surgical History  Procedure Laterality Date  . Dilation and curettage of uterus    . Cataract extraction      bilateral  age 24yo  . Dilatation & currettage/hysteroscopy with resectocope  02/28/2012    Procedure: DILATATION & CURETTAGE/HYSTEROSCOPY WITH RESECTOCOPE;  Surgeon: Genia Del, MD;  Location: [facility];  Service: Gynecology;  Laterality: N/A;  Requests resectoscope with the loop.  . Robotic assisted total hysterectomy with bilateral salpingo oopherectomy  04/07/2012    Procedure: ROBOTIC ASSISTED TOTAL HYSTERECTOMY WITH BILATERAL SALPINGO OOPHORECTOMY;  Surgeon: Rejeana Brock A. Duard Brady, MD;  Location: [facility];  Service: Gynecology;  Laterality: N/A;  . Lymph node dissection  04/07/2012    Procedure: LYMPH NODE DISSECTION;  Surgeon: Rejeana Brock A. Duard Brady, MD;  Location: [facility];  Service: Gynecology;  Laterality: Right;  pelvic    Past Medical Hx:  Past Medical History  Diagnosis Date  . Hypertension   . Sleep apnea     CPAP  . GERD (gastroesophageal reflux disease)   . 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 Irregular uterine bleeding   . Uterine polyp   . Dysrhythmia     "irregular in the past"- off meds  . Anxiety     regarding surgery  . Arthritis   . DDD (degenerative disc disease), lumbar   . Cancer     endometrial carcinoma    Oncology Hx:    Endometrioid carcinoma   02/28/2012 Surgery D&C for diagnosis of a grade I endometrial cancer   04/07/2012 Surgery robotic hystercomy, BSO, right pelvic LND. IB (60%) grade I endometrial cacner, + LVSI    - 07/17/2012 Radiation Therapy Completed  vaginal brachytherapy    Family Hx:  Family History  Problem Relation Age of Onset  . Diabetes Father   . Heart disease Sister   . Hypertension Sister   . Heart disease Brother   . Cancer Brother     Vitals:  Blood pressure 142/80, pulse 68, temperature 98.2 F (36.8 C), temperature source Oral, resp. rate 18, height 5' (1.524 m), weight 182 lb 12.8 oz (82.918 kg).  Physical Exam: Well-nourished vulva female in no acute distress.   Neck: Supple, no lymphadenopathy no thyromegaly.  Lungs: Clear to auscultation bilaterally.  Cardiovascular: Regular rate and rhythm.  Abdomen: Well-healed surgical incisions. No evidence of an incisional hernias. Abdomen soft, nontender, nondistended. Exam is somewhat limited by habitus.  Extremities: 1+ nonpitting edema equal  bilaterally. Changes consistent with osteoarthritis in her hands.  Pelvic: Normal external female genitalia. Markedly atrophic and narrow vagina. There are no visible lesions. Bimanual examination reveals no masses or nodularity. Rectal   Assessment/Plan: 79 year old gravida 0 virginal female who's been menopausal for 20 years and has stage IB grade 1 endometrial carcinoma. She did have myometrial invasion of 60% and lymphovascular space involvement. She completed vaginal cuff brachii therapy with Dr. Roselind Messier in March of 2014. Her exam today is unremarkable. She'll see Dr. Roselind Messier in 4 months and return to see [place] in 8. She will need a Pap smear when she sees Dr. Roselind Messier in 4 months as that'll BE about 1 year from the time of her diagnosis   Timber Marshman A., MD 10/29/2012, 1:45 PM

## 2012-10-29 NOTE — Patient Instructions (Signed)
Followup of gynecologic oncology in 8 months in radiation oncology in 4 months

## 2013-01-18 ENCOUNTER — Ambulatory Visit: Payer: Medicare Other | Admitting: Radiation Oncology

## 2013-03-12 ENCOUNTER — Encounter: Payer: Self-pay | Admitting: Oncology

## 2013-03-15 ENCOUNTER — Ambulatory Visit
Admission: RE | Admit: 2013-03-15 | Discharge: 2013-03-15 | Disposition: A | Discharge status: Home / Self Care | Payer: Medicare Other | Source: Ambulatory Visit | Attending: Radiation Oncology | Admitting: Radiation Oncology

## 2013-03-15 ENCOUNTER — Encounter: Payer: Self-pay | Admitting: Radiation Oncology

## 2013-03-15 ENCOUNTER — Other Ambulatory Visit (HOSPITAL_COMMUNITY)
Admission: RE | Admit: 2013-03-15 | Discharge: 2013-03-15 | Disposition: A | Discharge status: Home / Self Care | Payer: Medicare Other | Source: Ambulatory Visit | Attending: Radiation Oncology | Admitting: Radiation Oncology

## 2013-03-15 VITALS — BP 153/69 | HR 79 | Temp 98.1°F | Ht 60.0 in | Wt 188.3 lb

## 2013-03-15 DIAGNOSIS — C541 Malignant neoplasm of endometrium: Secondary | ICD-10-CM

## 2013-03-15 DIAGNOSIS — Z124 Encounter for screening for malignant neoplasm of cervix: Secondary | ICD-10-CM | POA: Insufficient documentation

## 2013-03-15 NOTE — Progress Notes (Signed)
Heidi Fleming here for follow up after treatment for endometrial cancer.  She denies pain.  She does have fatigue and shortness of breath with activity.  Her oxygen saturation today is 100%.  She denies diarrhea,  Rectal/vaginal bleeding and vaginal discharge.  She has noticed an urge to urinate when she does not need to go.

## 2013-03-15 NOTE — Progress Notes (Signed)
Radiation Oncology         [phone] ________________________________  Name: Heidi Fleming MRN: [id number]  Date: 03/15/2013  DOB: [birth year]  Follow-Up Visit Note  CC: Pearson Grippe, MD  Thompson Caul., MD  Diagnosis:   Stage IB endometrial adenocarcinoma  Interval Since Last Radiation:  8  months,  the patient received intracavitary brachytherapy treatments  Narrative:  The patient returns today for routine follow-up.  She is doing well and without complaints. She denies any vaginal bleeding hematuria or rectal bleeding. She denies any pelvic pain. She did see Dr. Duard Brady approximately 4 months ago and received a good report.                              ALLERGIES:  is allergic to latex.  Meds: Current Outpatient Prescriptions  Medication Sig Dispense Refill  . ALPRAZolam (XANAX) 0.5 MG tablet Take 0.5-1 mg by mouth at bedtime as needed. For sleep      . b complex vitamins tablet Take 1 tablet by mouth daily.      . calcium-vitamin D (OSCAL WITH D) 500-200 MG-UNIT per tablet Take 1 tablet by mouth daily.      . cholecalciferol (VITAMIN D) 1000 UNITS tablet Take 1,000 Units by mouth daily.      . clotrimazole-betamethasone (LOTRISONE) cream       . hydrochlorothiazide (HYDRODIURIL) 25 MG tablet Take 12.5 mg by mouth every morning.       Marland Kitchen ibuprofen (ADVIL,MOTRIN) 200 MG tablet Take 200 mg by mouth every 6 (six) hours as needed. For pain      . omeprazole (PRILOSEC) 20 MG capsule Take 20 mg by mouth daily.      . quinapril (ACCUPRIL) 20 MG tablet Take 20 mg by mouth every morning.       . vitamin C (ASCORBIC ACID) 500 MG tablet Take 500 mg by mouth daily.      Marland Kitchen HYDROcodone-acetaminophen (VICODIN) 5-500 MG per tablet Take 1 tablet by mouth every 6 (six) hours as needed for pain.  12 tablet  0   No current facility-administered medications for this encounter.    Physical Findings: The patient is in no acute distress. Patient is alert and oriented.  height is 5' (1.524 m) and  weight is 188 lb 4.8 oz (85.412 kg). Her temperature is 98.1 F (36.7 C). Her blood pressure is 153/69 and her pulse is 79. Her oxygen saturation is 100%. .  No palpable supraclavicular or axillary adenopathy. The lungs are clear to auscultation. The heart has regular rhythm and rate the abdomen is soft and nontender with normal bowel sounds. There is no inguinal adenopathy appreciated. On pelvic examination the external genitalia are unremarkable. A speculum exam is performed. Only a small speculum could be placed for the exam, with difficulty. No mucosal lesions were noted. A Pap smear was obtained of the proximal vagina. On bimanual and rectovaginal examination there no pelvic masses appreciated although the exam was somewhat compromised in light of the patient's anatomy.  Lab Findings: Lab Results  Component Value Date   WBC 13.3* 04/08/2012   HGB 11.7* 04/08/2012   HCT 34.6* 04/08/2012   MCV 81.6 04/08/2012   PLT 249 04/08/2012      Radiographic Findings: No results found.  Impression:  No evidence of recurrence on clinical exam today, Pap smear pending  Plan:  Routine followup in 8 months. In the interim the patient will  be seen in gynecologic oncology.  _____________________________________  -----------------------------------  Billie Lade, PhD, MD

## 2013-03-16 ENCOUNTER — Telehealth: Payer: Self-pay | Admitting: *Deleted

## 2013-03-16 NOTE — Telephone Encounter (Signed)
CALLED PATIENT TO INFORM OF FU VISIT BEING MOVED FROM 09-16-13 TO 11-15-13 AT 1:00 PM, LVM FOR A RETURN CALL

## 2013-03-22 ENCOUNTER — Telehealth: Payer: Self-pay | Admitting: Oncology

## 2013-03-22 NOTE — Telephone Encounter (Signed)
Left message for Heidi Fleming to call back at [phone].

## 2013-03-23 NOTE — Telephone Encounter (Signed)
Heidi Fleming called back.  Let her know that her pap smear results were good per Dr. Roselind Messier.

## 2013-07-01 ENCOUNTER — Encounter: Payer: Self-pay | Admitting: Gynecologic Oncology

## 2013-07-01 ENCOUNTER — Ambulatory Visit: Payer: Medicare Other | Attending: Gynecologic Oncology | Admitting: Gynecologic Oncology

## 2013-07-01 VITALS — BP 145/61 | HR 68 | Temp 98.5°F | Resp 18 | Ht 60.0 in | Wt 193.1 lb

## 2013-07-01 DIAGNOSIS — Z90710 Acquired absence of both cervix and uterus: Secondary | ICD-10-CM | POA: Insufficient documentation

## 2013-07-01 DIAGNOSIS — K219 Gastro-esophageal reflux disease without esophagitis: Secondary | ICD-10-CM | POA: Insufficient documentation

## 2013-07-01 DIAGNOSIS — Z923 Personal history of irradiation: Secondary | ICD-10-CM | POA: Insufficient documentation

## 2013-07-01 DIAGNOSIS — I10 Essential (primary) hypertension: Secondary | ICD-10-CM | POA: Insufficient documentation

## 2013-07-01 DIAGNOSIS — C549 Malignant neoplasm of corpus uteri, unspecified: Secondary | ICD-10-CM | POA: Insufficient documentation

## 2013-07-01 DIAGNOSIS — Z79899 Other long term (current) drug therapy: Secondary | ICD-10-CM | POA: Insufficient documentation

## 2013-07-01 DIAGNOSIS — G4730 Sleep apnea, unspecified: Secondary | ICD-10-CM | POA: Insufficient documentation

## 2013-07-01 DIAGNOSIS — IMO0002 Reserved for concepts with insufficient information to code with codable children: Secondary | ICD-10-CM

## 2013-07-01 NOTE — Progress Notes (Signed)
Consult Note: Gyn-Onc  Heidi Fleming 80 y.o. female  CC:  Chief Complaint  Patient presents with  . Follow-up    HPI: Patient is a 81 year old gravida 51 virginal female who has been menopausal for greater than 20 years. She had been having some pink spotting for the past few months with no pain associated with it. She stated that many times it was just a pink discharge admixed with mucus. She's not sure why she delayed in getting care but ultimately went to see Dr. Dellis Fleming and had ultrasound performed showed a thick irregular endometrium of 22 mm. On February 28, 2012 she underwent dilation and curettage with hysteroscopy. Operative findings included endometrial polyps and a submucosal myoma. Pathology revealed a focal endometrioid carcinoma FIGO grade 1 arising in a background of atypical simple and complex hyperplasia. There were fragments of a leiomyoma.   She underwent a total robotic hysterectomy bilateral salpingo-oophorectomy and right pelvic lymph node sampling on March 22, 2012. Operative findings included the uterus with adhesions of rectosigmoid colon with obliteration of the cul-de-sac. Bilateral ovaries were here in to the midline. She had bilateral retroperitoneal fibrosis. On frozen section showed a grade 1 lesion with less than 50% myometrial invasion on frozen section.   Final pathology revealed a grade 1 endometrioid adenocarcinoma with 60% myometrial invasion (0.6 cm out of 1 cm myometrial invasion. She had lymphovascular space involvement present. She had stage IB disease and was dispositioned to vaginal cuff brachytherapy.  She completed vaginal cuff brachytherapy under the care of Dr. Sondra Fleming in March of 2014. I last saw her in July 2014 at which time her exam was unremarkable. She was seen by Dr. Sondra Fleming in November 2014 and had a negative Pap smear at that time.  Interval History:  She is overall doing quite well. She states that she does have days where she sleeps poorly.  She feels a little bit weak and "wobbly". She's not sure if some of that is from her back pain. She has steadily been gaining weight has gained approximately 10 pounds since we last saw her in July 2014. She states that her appetite is very good she's always hungry. She states that she is been craving sweets. She's not had any episodes of falls. She states her primary physician check her blood work and she is not anemic and there is no other abnormalities. She does complain of some shortness of breath with activity but never associated with any chest pain or diaphoresis.  Review of Systems:  Constitutional: Feels occasionally weak and "wobbly". Denies fever. Skin: No rash Cardiovascular: No chest pain or edema, + SOB with activity Pulmonary: No cough Gastro Intestinal: No nausea, vomiting, constipation, or diarrhea reported. No bright red blood per rectum or change in bowel movement.  Genitourinary: No frequency, urgency, or dysuria.  Denies vaginal bleeding and discharge. She is using her vaginal dilator 3 times a week for 10 minutes at a time.  Musculoskeletal: No myalgia, arthralgia, joint swelling or pain. She does complain of some low back pain due to arthritis.  Neurologic: No weakness, numbness, or change in gait.  Psychology: Not sleeping as well   Current Meds:  Outpatient Encounter Prescriptions as of 07/01/2013  Medication Sig  . ALPRAZolam (XANAX) 0.5 MG tablet Take 0.5-1 mg by mouth at bedtime as needed. For sleep  . b complex vitamins tablet Take 1 tablet by mouth daily.  . calcium-vitamin D (OSCAL WITH D) 500-200 MG-UNIT per tablet Take 1 tablet by mouth  daily.  . cholecalciferol (VITAMIN D) 1000 UNITS tablet Take 1,000 Units by mouth daily.  . hydrochlorothiazide (HYDRODIURIL) 25 MG tablet Take 12.5 mg by mouth every morning.   Marland Kitchen omeprazole (PRILOSEC) 20 MG capsule Take 20 mg by mouth daily.  . quinapril (ACCUPRIL) 20 MG tablet Take 20 mg by mouth every morning.   . vitamin C  (ASCORBIC ACID) 500 MG tablet Take 500 mg by mouth daily.  . clotrimazole-betamethasone (LOTRISONE) cream   . HYDROcodone-acetaminophen (VICODIN) 5-500 MG per tablet Take 1 tablet by mouth every 6 (six) hours as needed for pain.  Marland Kitchen ibuprofen (ADVIL,MOTRIN) 200 MG tablet Take 200 mg by mouth every 6 (six) hours as needed. For pain    Allergy:  Allergies  Allergen Reactions  . Latex Itching    Social Hx:   History   Social History  . Marital Status: Single    Spouse Name: N/A    Number of Children: N/A  . Years of Education: N/A   Occupational History  . Not on file.   Social History Main Topics  . Smoking status: Former Smoker -- 1 years    Quit date: 04/23/1943  . Smokeless tobacco: Never Used  . Alcohol Use: No  . Drug Use: No  . Sexual Activity: Not on file   Other Topics Concern  . Not on file   Social History Narrative  . No narrative on file    Past Surgical Hx:  Past Surgical History  Procedure Laterality Date  . Dilation and curettage of uterus    . Cataract extraction      bilateral  age 89yo  . Dilatation & currettage/hysteroscopy with resectocope  02/28/2012    Procedure: Heidi Fleming;  Surgeon: Heidi Bruins, MD;  Location: [facility];  Service: Gynecology;  Laterality: N/A;  Requests resectoscope with the loop.  . Robotic assisted total hysterectomy with bilateral salpingo oopherectomy  04/07/2012    Procedure: ROBOTIC ASSISTED TOTAL HYSTERECTOMY WITH BILATERAL SALPINGO OOPHORECTOMY;  Surgeon: Heidi Gurney A. Alycia Rossetti, MD;  Location: [facility];  Service: Gynecology;  Laterality: N/A;  . Lymph node dissection  04/07/2012    Procedure: LYMPH NODE DISSECTION;  Surgeon: Heidi Gurney A. Alycia Rossetti, MD;  Location: [facility];  Service: Gynecology;  Laterality: Right;  pelvic    Past Medical Hx:  Past Medical History  Diagnosis Date  . Hypertension   . Sleep apnea     CPAP  . GERD (gastroesophageal reflux disease)   . Endometrioid carcinoma  02/28/12  . Irregular uterine bleeding   . Uterine polyp   . Dysrhythmia     "irregular in the past"- off meds  . Anxiety     regarding surgery  . Arthritis   . DDD (degenerative disc disease), lumbar   . Cancer     endometrial carcinoma  . History of radiation therapy 06/17/12, 06/24/2012, 07/01/2012, 07/16/2012    30 gy proximal vagina    Oncology Hx:    Endometrioid carcinoma   02/28/2012 Surgery D&C for diagnosis of a grade I endometrial cancer   04/07/2012 Surgery robotic hystercomy, BSO, right pelvic LND. IB (60%) grade I endometrial cacner, + LVSI    - 07/17/2012 Radiation Therapy Completed vaginal brachytherapy    Family Hx:  Family History  Problem Relation Age of Onset  . Diabetes Father   . Heart disease Sister   . Hypertension Sister   . Heart disease Brother   . Cancer Brother     Vitals:  Blood pressure  145/61, pulse 68, temperature 98.5 F (36.9 C), temperature source Oral, resp. rate 18, height 5' (1.524 m), weight 193 lb 1.6 oz (87.59 kg).  Physical Exam: Well-nourished vulva female in no acute distress.   Neck: Supple, no lymphadenopathy no thyromegaly.  Lungs: Clear to auscultation bilaterally.  Cardiovascular: Regular rate and rhythm.  Abdomen: Well-healed surgical incisions. No evidence of an incisional hernias. Abdomen soft, nontender, nondistended. Exam is somewhat limited by habitus.  Extremities: 1+ nonpitting edema equal bilaterally. Changes consistent with osteoarthritis in her hands.  Pelvic: Normal external female genitalia. Markedly atrophic and narrow vagina. There are no visible lesions. Bimanual examination reveals no masses or nodularity. Rectal confirms.   Assessment/Plan: 80 year old gravida 0 virginal female who's been menopausal for 20 years and has stage IB grade 1 endometrial carcinoma. She did have myometrial invasion of 60% and lymphovascular space involvement. She completed vaginal cuff brachytherapy with Dr. Sondra Fleming in March of  2014. Her exam today is unremarkable. She'll see Dr. Sondra Fleming in 4 months and return to see [place] in 8. She will need a Pap smear in November/December of 2015.  Heidi Cadenas A., MD 07/01/2013, 1:10 PM

## 2013-07-01 NOTE — Patient Instructions (Signed)
Follow up with Dr. Sondra Come in 4 months and return to see Dr. Alycia Rossetti in 8 months

## 2013-09-16 ENCOUNTER — Ambulatory Visit: Payer: Medicare Other | Admitting: Radiation Oncology

## 2013-11-15 ENCOUNTER — Ambulatory Visit
Admission: RE | Admit: 2013-11-15 | Discharge: 2013-11-15 | Disposition: A | Discharge status: Home / Self Care | Payer: Medicare Other | Source: Ambulatory Visit | Attending: Radiation Oncology | Admitting: Radiation Oncology

## 2013-11-15 ENCOUNTER — Other Ambulatory Visit (HOSPITAL_COMMUNITY)
Admission: RE | Admit: 2013-11-15 | Discharge: 2013-11-15 | Disposition: A | Discharge status: Home / Self Care | Payer: Medicare Other | Source: Ambulatory Visit | Attending: Radiation Oncology | Admitting: Radiation Oncology

## 2013-11-15 ENCOUNTER — Ambulatory Visit: Payer: Medicare Other | Admitting: Radiation Oncology

## 2013-11-15 ENCOUNTER — Encounter: Payer: Self-pay | Admitting: Radiation Oncology

## 2013-11-15 VITALS — BP 154/82 | HR 79 | Temp 98.1°F | Ht 60.0 in | Wt 195.6 lb

## 2013-11-15 DIAGNOSIS — Z124 Encounter for screening for malignant neoplasm of cervix: Secondary | ICD-10-CM | POA: Insufficient documentation

## 2013-11-15 DIAGNOSIS — C541 Malignant neoplasm of endometrium: Secondary | ICD-10-CM

## 2013-11-15 NOTE — Progress Notes (Signed)
Heidi Fleming here for follow up after treatment for endometrial cancer.  She denies pain, bladder issues, diarrhea, rectal/vagina bleeding and nausea.  She reports having occasional constipation.  She also reports feeling weak and short of breath, especially with walking.  Her oxygen saturation is 100% on room air today.

## 2013-11-15 NOTE — Progress Notes (Signed)
Radiation Oncology         [phone]) [phone] ________________________________  Name: Heidi Fleming MRN: [id number]  Date: 11/15/2013  DOB: [birth year]  Follow-Up Visit Note  CC: [name], MD  Sherol Dade., MD  Diagnosis:  Stage IB endometrial adenocarcinoma   Interval Since Last Radiation:  One year and 4 months,  the patient received intracavitary brachytherapy treatments   Narrative:  The patient returns today for routine follow-up.  She is doing well and without complaints. She does notice some mild discomfort with placing her vaginal cylinder. She denies any vaginal bleeding with this procedure. She denies any hematuria or rectal bleeding or pelvic discomfort.                              ALLERGIES:  is allergic to latex.  Meds: Current Outpatient Prescriptions  Medication Sig Dispense Refill  . ALPRAZolam (XANAX) 0.5 MG tablet Take 0.5-1 mg by mouth at bedtime as needed. For sleep      . cholecalciferol (VITAMIN D) 1000 UNITS tablet Take 1,000 Units by mouth daily.      . cyanocobalamin 1000 MCG tablet Take 100 mcg by mouth daily.      . hydrochlorothiazide (HYDRODIURIL) 25 MG tablet Take 12.5 mg by mouth every morning.       Marland Kitchen ibuprofen (ADVIL,MOTRIN) 200 MG tablet Take 200 mg by mouth every 6 (six) hours as needed. For pain      . omeprazole (PRILOSEC) 20 MG capsule Take 20 mg by mouth daily.      . quinapril (ACCUPRIL) 20 MG tablet Take 20 mg by mouth every morning.       . vitamin C (ASCORBIC ACID) 500 MG tablet Take 500 mg by mouth daily.      Marland Kitchen b complex vitamins tablet Take 1 tablet by mouth daily.      . calcium-vitamin D (OSCAL WITH D) 500-200 MG-UNIT per tablet Take 1 tablet by mouth daily.      . clotrimazole-betamethasone (LOTRISONE) cream       . HYDROcodone-acetaminophen (VICODIN) 5-500 MG per tablet Take 1 tablet by mouth every 6 (six) hours as needed for pain.  12 tablet  0   No current facility-administered medications for this encounter.    Physical  Findings: The patient is in no acute distress. Patient is alert and oriented.  height is 5' (1.524 m) and weight is 195 lb 9.6 oz (88.724 kg). Her oral temperature is 98.1 F (36.7 C). Her blood pressure is 154/82 and her pulse is 79. Marland Kitchen  No palpable supraclavicular or axillary adenopathy. The lungs are clear to auscultation. The heart has regular rhythm and rate. The abdomen is soft and nontender with normal bowel sounds. The inguinal areas are free of adenopathy. Patient has some mild erythema to the right inguinal area suspicious for possible candidal infection. Patient does have cream to put on this area as needed. On pelvic examination the external genitalia are unremarkable. A speculum exam is performed using the small size. There no obvious mucosal lesions noted. A Pap smear was obtained of the proximal vagina. No pelvic masses on bimanual exam however exam is somewhat difficult in light of the patient's body habitus.  Lab Findings: Lab Results  Component Value Date   WBC 13.3* 04/08/2012   HGB 11.7* 04/08/2012   HCT 34.6* 04/08/2012   MCV 81.6 04/08/2012   PLT 249 04/08/2012      Radiographic  Findings: No results found.  Impression:  No evidence of recurrence on clinical exam today, Pap smear pending  Plan:  Routine followup in 8 months. In the interim the patient will be seen by gynecologic oncology.  ____________________________________ Blair Promise, MD

## 2013-11-17 LAB — CYTOLOGY - PAP

## 2013-11-26 ENCOUNTER — Telehealth: Payer: Self-pay | Admitting: Oncology

## 2013-11-26 NOTE — Telephone Encounter (Signed)
Called Syanne and told her that her pap smear results were good per Dr. Sondra Come.  Jaimy verbalized understanding.

## 2014-04-07 ENCOUNTER — Ambulatory Visit: Payer: Medicare Other | Attending: Gynecologic Oncology | Admitting: Gynecologic Oncology

## 2014-04-07 ENCOUNTER — Encounter: Payer: Self-pay | Admitting: Gynecologic Oncology

## 2014-04-07 ENCOUNTER — Other Ambulatory Visit (HOSPITAL_COMMUNITY)
Admission: RE | Admit: 2014-04-07 | Discharge: 2014-04-07 | Disposition: A | Discharge status: Home / Self Care | Payer: Medicare Other | Source: Ambulatory Visit | Attending: Gynecologic Oncology | Admitting: Gynecologic Oncology

## 2014-04-07 VITALS — BP 170/82 | HR 79 | Temp 97.7°F | Resp 20 | Ht 60.0 in | Wt 196.6 lb

## 2014-04-07 DIAGNOSIS — Z01411 Encounter for gynecological examination (general) (routine) with abnormal findings: Secondary | ICD-10-CM | POA: Diagnosis present

## 2014-04-07 DIAGNOSIS — Z923 Personal history of irradiation: Secondary | ICD-10-CM | POA: Insufficient documentation

## 2014-04-07 DIAGNOSIS — I10 Essential (primary) hypertension: Secondary | ICD-10-CM | POA: Insufficient documentation

## 2014-04-07 DIAGNOSIS — C541 Malignant neoplasm of endometrium: Secondary | ICD-10-CM | POA: Diagnosis present

## 2014-04-07 DIAGNOSIS — Z780 Asymptomatic menopausal state: Secondary | ICD-10-CM | POA: Diagnosis not present

## 2014-04-07 DIAGNOSIS — IMO0002 Reserved for concepts with insufficient information to code with codable children: Secondary | ICD-10-CM

## 2014-04-07 DIAGNOSIS — Z8542 Personal history of malignant neoplasm of other parts of uterus: Secondary | ICD-10-CM

## 2014-04-07 NOTE — Progress Notes (Signed)
Consult Note: Gyn-Onc  Heidi Fleming 80 y.o. female  CC:  Chief Complaint  Patient presents with  . endometrial carcinoma    HPI: Patient is a 80 year old gravida 39 virginal female who has been menopausal for greater than 20 years. She had been having some pink spotting for the past few months with no pain associated with it. She stated that many times it was just a pink discharge admixed with mucus. She's not sure why she delayed in getting care but ultimately went to see Dr. Dellis Filbert and had ultrasound performed showed a thick irregular endometrium of 22 mm. On February 28, 2012 she underwent dilation and curettage with hysteroscopy. Operative findings included endometrial polyps and a submucosal myoma. Pathology revealed a focal endometrioid carcinoma FIGO grade 1 arising in a background of atypical simple and complex hyperplasia. There were fragments of a leiomyoma.   She underwent a total robotic hysterectomy bilateral salpingo-oophorectomy and right pelvic lymph node sampling on March 22, 2012. Operative findings included the uterus with adhesions of rectosigmoid colon with obliteration of the cul-de-sac. Bilateral ovaries were here in to the midline. She had bilateral retroperitoneal fibrosis. On frozen section showed a grade 1 lesion with less than 50% myometrial invasion on frozen section.   Final pathology revealed a grade 1 endometrioid adenocarcinoma with 60% myometrial invasion (0.6 cm out of 1 cm myometrial invasion. She had lymphovascular space involvement present. She had stage IB disease and was dispositioned to vaginal cuff brachytherapy.  She completed vaginal cuff brachytherapy under the care of Dr. Sondra Come in March of 2014. I last saw her in March 2015 at which time her exam was unremarkable. She was seen by Dr. Sondra Come in July 2015 and had a negative Pap smear at that time.  Interval History:  She is overall doing quite well. She is some of the same complaints that she did when  I last saw her. She has some shortness of breath with activity and is not able to do as many things as she used to. She denies any chest pain or diaphoresis without shortness of breath. Her appetite is to be very good. She's gained an additional 3-1/2 pounds since we saw her. There are no changes in her medical history. She does use her vaginal dilator about 3 times per week but does not feel that she is able to get it in very deeply. There is no bleeding associated with it. She denies any change in her bowel or bladder habits. She states it has been over a year since her last mammogram. She did not have one this year.  Review of Systems:  Constitutional: Feels occasionally weak and gets tired easily. Denies fever. Skin: No rash Cardiovascular: No chest pain or edema, + SOB with activity Pulmonary: No cough Gastro Intestinal: No nausea, vomiting, constipation, or diarrhea reported. No bright red blood per rectum or change in bowel movement.  Genitourinary: No frequency, urgency, or dysuria.  Denies vaginal bleeding and discharge. She is using her vaginal dilator 3 times a week for 10 minutes at a time.  Musculoskeletal: No myalgia, arthralgia, joint swelling or pain. She does complain of some low back pain due to arthritis.  Neurologic: Slower than she used to be Psychology: No changes   Current Meds:  Outpatient Encounter Prescriptions as of 04/07/2014  Medication Sig  . ALPRAZolam (XANAX) 0.5 MG tablet Take 0.5-1 mg by mouth at bedtime as needed. For sleep  . calcium-vitamin D (OSCAL WITH D) 500-200 MG-UNIT per tablet  Take 1 tablet by mouth daily.  . cholecalciferol (VITAMIN D) 1000 UNITS tablet Take 1,000 Units by mouth daily.  . hydrochlorothiazide (HYDRODIURIL) 25 MG tablet Take 12.5 mg by mouth every morning.   Marland Kitchen ibuprofen (ADVIL,MOTRIN) 200 MG tablet Take 200 mg by mouth every 6 (six) hours as needed. For pain  . omeprazole (PRILOSEC) 20 MG capsule Take 20 mg by mouth daily.  .  quinapril (ACCUPRIL) 20 MG tablet Take 20 mg by mouth every morning.   . vitamin C (ASCORBIC ACID) 500 MG tablet Take 500 mg by mouth daily.  Marland Kitchen b complex vitamins tablet Take 1 tablet by mouth daily.  . clotrimazole-betamethasone (LOTRISONE) cream   . HYDROcodone-acetaminophen (VICODIN) 5-500 MG per tablet Take 1 tablet by mouth every 6 (six) hours as needed for pain. (Patient not taking: Reported on 04/07/2014)  . [DISCONTINUED] cyanocobalamin 1000 MCG tablet Take 100 mcg by mouth daily.  . [DISCONTINUED] FLUZONE HIGH-DOSE 0.5 ML SUSY     Allergy:  Allergies  Allergen Reactions  . Latex Itching    Social Hx:   History   Social History  . Marital Status: Single    Spouse Name: N/A    Number of Children: N/A  . Years of Education: N/A   Occupational History  . Not on file.   Social History Main Topics  . Smoking status: Former Smoker -- 1 years    Quit date: 04/23/1943  . Smokeless tobacco: Never Used  . Alcohol Use: No  . Drug Use: No  . Sexual Activity: Not on file   Other Topics Concern  . Not on file   Social History Narrative    Past Surgical Hx:  Past Surgical History  Procedure Laterality Date  . Dilation and curettage of uterus    . Cataract extraction      bilateral  age 48yo  . Dilatation & currettage/hysteroscopy with resectocope  02/28/2012    Procedure: [place];  Surgeon: Princess Bruins, MD;  Location: [facility];  Service: Gynecology;  Laterality: N/A;  Requests resectoscope with the loop.  . Robotic assisted total hysterectomy with bilateral salpingo oopherectomy  04/07/2012    Procedure: ROBOTIC ASSISTED TOTAL HYSTERECTOMY WITH BILATERAL SALPINGO OOPHORECTOMY;  Surgeon: Imagene Gurney A. Alycia Rossetti, MD;  Location: [facility];  Service: Gynecology;  Laterality: N/A;  . Lymph node dissection  04/07/2012    Procedure: LYMPH NODE DISSECTION;  Surgeon: Imagene Gurney A. Alycia Rossetti, MD;  Location: [facility];  Service: Gynecology;  Laterality:  Right;  pelvic    Past Medical Hx:  Past Medical History  Diagnosis Date  . Hypertension   . Sleep apnea     CPAP  . GERD (gastroesophageal reflux disease)   . 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 Irregular uterine bleeding   . Uterine polyp   . Dysrhythmia     "irregular in the past"- off meds  . Anxiety     regarding surgery  . Arthritis   . DDD (degenerative disc disease), lumbar   . Cancer     endometrial carcinoma  . History of radiation therapy 06/17/12, 06/24/2012, 07/01/2012, 07/16/2012    30 gy proximal vagina    Oncology Hx:    Endometrioid carcinoma   02/28/2012 Surgery D&C for diagnosis of a grade I endometrial cancer   04/07/2012 Surgery robotic hystercomy, BSO, right pelvic LND. IB (60%) grade I endometrial cacner, + LVSI    - 07/17/2012 Radiation Therapy Completed vaginal brachytherapy    Family Hx:  Family History  Problem Relation Age of Onset  . Diabetes Father   . Heart disease Sister   . Hypertension Sister   . Heart disease Brother   . Cancer Brother     Vitals:  Blood pressure 170/82, pulse 79, temperature 97.7 F (36.5 C), temperature source Oral, resp. rate 20, height 5' (1.524 m), weight 196 lb 9.6 oz (89.177 kg), SpO2 100 %.  Physical Exam: Well-nourished vulva female in no acute distress.   Neck: Supple, no lymphadenopathy no thyromegaly.  Lungs: Clear to auscultation bilaterally.  Cardiovascular: Regular rate and rhythm.  Abdomen: Well-healed surgical incisions. No evidence of an incisional hernias. Abdomen soft, nontender, nondistended. Exam is somewhat limited by habitus.  Extremities: 1+ nonpitting edema equal bilaterally. Changes consistent with osteoarthritis in her hands.  Pelvic: Normal external female genitalia. Markedly atrophic and narrow vagina. There are no visible lesions. Vaginal cuff without lesions, pap smear submitted.  Bimanual examination reveals no masses or nodularity. Rectal confirms.   Assessment/Plan: 80 year old  gravida 0 virginal female who's been menopausal for 20 years and has stage IB grade 1 endometrial carcinoma diagnosed in 12/13. She did have myometrial invasion of 60% and lymphovascular space involvement. She completed vaginal cuff brachytherapy with Dr. Sondra Come in March of 2014. Her exam today is unremarkable. She'll see Dr. Sondra Come in 6 months and return to see [place] in 12. We will follow up on the results of her pap smear.  Allene Furuya A., MD 04/07/2014, 2:55 PM

## 2014-04-07 NOTE — Patient Instructions (Signed)
We will notify you of the results of your Pap smear. Please return to see Dr. Sondra Come in 6 months return to see [place] in one year

## 2014-04-11 LAB — CYTOLOGY - PAP

## 2014-04-12 ENCOUNTER — Telehealth: Payer: Self-pay | Admitting: *Deleted

## 2014-04-12 NOTE — Telephone Encounter (Signed)
Called pt with pap smear results as noted below. Told patient to please call us back with any further concerns or issues - pt agreeable to this.

## 2014-04-12 NOTE — Telephone Encounter (Signed)
-----   Message from Dorothyann Gibbs, NP sent at 04/11/2014  4:12 PM EST ----- Please let her know pap is normal ----- Message -----    From: Lab in [facility] Interface    Sent: 04/11/2014   2:40 PM      To: Dorothyann Gibbs, NP

## 2015-04-12 ENCOUNTER — Ambulatory Visit: Payer: Medicare Other | Attending: Gynecologic Oncology | Admitting: Gynecologic Oncology

## 2015-04-12 ENCOUNTER — Encounter: Payer: Self-pay | Admitting: Gynecologic Oncology

## 2015-04-12 ENCOUNTER — Other Ambulatory Visit (HOSPITAL_COMMUNITY)
Admission: RE | Admit: 2015-04-12 | Discharge: 2015-04-12 | Disposition: A | Discharge status: Home / Self Care | Payer: Medicare Other | Source: Ambulatory Visit | Attending: Gynecologic Oncology | Admitting: Gynecologic Oncology

## 2015-04-12 VITALS — BP 171/94 | HR 85 | Temp 97.4°F | Resp 18 | Wt 188.6 lb

## 2015-04-12 DIAGNOSIS — IMO0002 Reserved for concepts with insufficient information to code with codable children: Secondary | ICD-10-CM

## 2015-04-12 DIAGNOSIS — C541 Malignant neoplasm of endometrium: Secondary | ICD-10-CM | POA: Diagnosis not present

## 2015-04-12 DIAGNOSIS — Z01411 Encounter for gynecological examination (general) (routine) with abnormal findings: Secondary | ICD-10-CM | POA: Diagnosis present

## 2015-04-12 DIAGNOSIS — Z8542 Personal history of malignant neoplasm of other parts of uterus: Secondary | ICD-10-CM

## 2015-04-12 NOTE — Progress Notes (Signed)
Consult Note: Gyn-Onc  Heidi Fleming 81 y.o. female  CC:  Chief Complaint  Patient presents with  . endometrial cancer    Follow up    HPI: Patient is a 81 year old gravida 61 virginal female who had been menopausal for greater than 20 years. She had been having some pink spotting for the past few months with no pain associated with it. She stated that many times it was just a pink discharge admixed with mucus. She's not sure why she delayed in getting care but ultimately went to see Dr. Dellis Filbert and had ultrasound performed showed a thick irregular endometrium of 22 mm. On February 28, 2012 she underwent dilation and curettage with hysteroscopy. Operative findings included endometrial polyps and a submucosal myoma. Pathology revealed a focal endometrioid carcinoma FIGO grade 1 arising in a background of atypical simple and complex hyperplasia. There were fragments of a leiomyoma.   She underwent a total robotic hysterectomy bilateral salpingo-oophorectomy and right pelvic lymph node sampling on March 22, 2012. Operative findings included the uterus with adhesions of rectosigmoid colon with obliteration of the cul-de-sac. Bilateral ovaries were here in to the midline. She had bilateral retroperitoneal fibrosis. On frozen section showed a grade 1 lesion with less than 50% myometrial invasion on frozen section.   Final pathology revealed a grade 1 endometrioid adenocarcinoma with 60% myometrial invasion (0.6 cm out of 1 cm myometrial invasion. She had lymphovascular space involvement present. She had stage IB disease and was dispositioned to vaginal cuff brachytherapy.  She completed vaginal cuff brachytherapy under the care of Dr. Sondra Come in March of 2014. I last saw her in Dec. 2015 at which time her exam was unremarkable as was her pap smear.   Interval History:  She is overall doing quite well.  She did not see Dr. Sondra Come in between appointments. She weighs 8 pounds less than last year than last.  She stay she's not been really trying to lose weight but has been busier and she thinks that makes a difference. Her sister is quite bothered with arthritis-type issues and is not able to ambulate very well. The patient's nephew has been diagnosed with metastatic cancer of unknown primary. He 79 years old. The patient does complain of feeling unsteady. She's not had any falls. She does have shortness of breath with essentially every activity but there is no chest pain. She's not had a mammogram in many years and is not sure that she is going to do them. Her appetite is quite good.  Review of Systems:  Constitutional: Feels occasionally weak and gets tired easily. Denies fever. Skin: No rash Cardiovascular: No chest pain or edema, + SOB with activity Pulmonary: No cough Gastro Intestinal: No nausea, vomiting, constipation, or diarrhea reported. No bright red blood per rectum or change in bowel movement.  Genitourinary: No frequency, urgency, or dysuria.  Denies vaginal bleeding and discharge.  Musculoskeletal: No myalgia, arthralgia, joint swelling or pain. She does complain of some low back pain due to arthritis.  Neurologic: Slower than she used to be Psychology: No changes  Current Meds:  Outpatient Encounter Prescriptions as of 04/12/2015  Medication Sig  . ALPRAZolam (XANAX) 0.5 MG tablet Take 0.5-1 mg by mouth at bedtime as needed. For sleep  . b complex vitamins tablet Take 1 tablet by mouth daily.  . calcium-vitamin D (OSCAL WITH D) 500-200 MG-UNIT per tablet Take 1 tablet by mouth daily.  . cholecalciferol (VITAMIN D) 1000 UNITS tablet Take 1,000 Units by mouth daily.  Marland Kitchen  clotrimazole-betamethasone (LOTRISONE) cream Reported on 04/12/2015  . hydrochlorothiazide (HYDRODIURIL) 25 MG tablet Take 12.5 mg by mouth every morning.   Marland Kitchen HYDROcodone-acetaminophen (VICODIN) 5-500 MG per tablet Take 1 tablet by mouth every 6 (six) hours as needed for pain.  Marland Kitchen ibuprofen (ADVIL,MOTRIN) 200 MG tablet  Take 200 mg by mouth every 6 (six) hours as needed. For pain  . omeprazole (PRILOSEC) 20 MG capsule Take 20 mg by mouth daily.  . quinapril (ACCUPRIL) 20 MG tablet Take 20 mg by mouth every morning.   . vitamin C (ASCORBIC ACID) 500 MG tablet Take 500 mg by mouth daily.   No facility-administered encounter medications on file as of 04/12/2015.    Allergy:  Allergies  Allergen Reactions  . Latex Itching    Social Hx:   Social History   Social History  . Marital Status: Single    Spouse Name: N/A  . Number of Children: N/A  . Years of Education: N/A   Occupational History  . Not on file.   Social History Main Topics  . Smoking status: Former Smoker -- 1 years    Quit date: 04/22/1957  . Smokeless tobacco: Never Used  . Alcohol Use: No  . Drug Use: No  . Sexual Activity: Not on file   Other Topics Concern  . Not on file   Social History Narrative    Past Surgical Hx:  Past Surgical History  Procedure Laterality Date  . Dilation and curettage of uterus    . Cataract extraction      bilateral  age 51yo  . Dilatation & currettage/hysteroscopy with resectocope  02/28/2012    Procedure: [place];  Surgeon: Princess Bruins, MD;  Location: [facility];  Service: Gynecology;  Laterality: N/A;  Requests resectoscope with the loop.  . Robotic assisted total hysterectomy with bilateral salpingo oopherectomy  04/07/2012    Procedure: ROBOTIC ASSISTED TOTAL HYSTERECTOMY WITH BILATERAL SALPINGO OOPHORECTOMY;  Surgeon: Imagene Gurney A. Alycia Rossetti, MD;  Location: [facility];  Service: Gynecology;  Laterality: N/A;  . Lymph node dissection  04/07/2012    Procedure: LYMPH NODE DISSECTION;  Surgeon: Imagene Gurney A. Alycia Rossetti, MD;  Location: [facility];  Service: Gynecology;  Laterality: Right;  pelvic    Past Medical Hx:  Past Medical History  Diagnosis Date  . Hypertension   . Sleep apnea     CPAP  . GERD (gastroesophageal reflux disease)   . Endometrioid carcinoma  02/28/12  . Irregular uterine bleeding   . Uterine polyp   . Dysrhythmia     "irregular in the past"- off meds  . Anxiety     regarding surgery  . Arthritis   . DDD (degenerative disc disease), lumbar   . Cancer [facility])     endometrial carcinoma  . History of radiation therapy 06/17/12, 06/24/2012, 07/01/2012, 07/16/2012    30 gy proximal vagina    Oncology Hx:    Endometrioid carcinoma   02/28/2012 Surgery D&C for diagnosis of a grade I endometrial cancer   04/07/2012 Surgery robotic hystercomy, BSO, right pelvic LND. IB (60%) grade I endometrial cacner, + LVSI    - 07/17/2012 Radiation Therapy Completed vaginal brachytherapy    Family Hx:  Family History  Problem Relation Age of Onset  . Diabetes Father   . Heart disease Sister   . Hypertension Sister   . Heart disease Brother   . Cancer Brother     Vitals:  Blood pressure 171/94, pulse 85, temperature 97.4 F (36.3 C), temperature  source Oral, resp. rate 18, weight 188 lb 9.6 oz (85.548 kg).  Physical Exam: Well-nourished vulva female in no acute distress.   Neck: Supple, no lymphadenopathy no thyromegaly.  Lungs: Clear to auscultation bilaterally.  Cardiovascular: Regular rate and rhythm.  Abdomen: Well-healed surgical incisions. No evidence of an incisional hernias. Abdomen soft, nontender, nondistended. Exam is somewhat limited by habitus.  Extremities: 1+ pitting edema equal bilaterally. Changes consistent with osteoarthritis in her hands.  Pelvic: Normal external female genitalia. Markedly atrophic and narrow vagina. There are no visible lesions. Vaginal cuff without lesions, pap smear submitted.  Bimanual examination reveals no masses or nodularity. Rectal confirms. Patient poorly tolerant of exam.   Assessment/Plan: 81 year old gravida 0 virginal female who's been menopausal for 20 years and has stage IB grade 1 endometrial carcinoma diagnosed in 12/13. She did have myometrial invasion of 60% and lymphovascular  space involvement. She completed vaginal cuff brachytherapy with Dr. Sondra Come in March of 2014. Her exam today is unremarkable. She'll see Dr. Sondra Come in 6 months and return to see [place] in 12. We will follow up on the results of her pap smear from today.  Cache Decoursey A., MD 04/12/2015, 3:05 PM

## 2015-04-12 NOTE — Patient Instructions (Addendum)
Follow up with Dr. Sondra Come in 6 months and return to see Gyn Oncology in 12 months.  We will notify you of the results of your Pap smear. Please call us after you see Dr. Sondra Come to schedule your appt for Dr. Alycia Rossetti

## 2015-04-12 NOTE — Addendum Note (Signed)
Addended by: Elizebeth Koller on: 04/12/2015 03:53 PM   Modules accepted: Orders

## 2015-04-14 LAB — CYTOLOGY - PAP

## 2015-04-19 ENCOUNTER — Telehealth: Payer: Self-pay

## 2015-04-19 NOTE — Telephone Encounter (Signed)
Orders received from [place], APNP to contact the patient to update with PAP results obtained on Apr 12, 2015 during her visit with Dr Alycia Rossetti was "normal" . Attempted to contcat the patient , no answer , left a detailed message with PAP results are Normal , call back number provided if additional questions arise.

## 2015-10-05 ENCOUNTER — Ambulatory Visit: Payer: Medicare Other | Admitting: Radiation Oncology

## 2015-10-12 ENCOUNTER — Ambulatory Visit
Admission: RE | Admit: 2015-10-12 | Discharge: 2015-10-12 | Disposition: A | Discharge status: Home / Self Care | Payer: Medicare Other | Source: Ambulatory Visit | Attending: Radiation Oncology | Admitting: Radiation Oncology

## 2015-10-12 ENCOUNTER — Encounter: Payer: Self-pay | Admitting: Radiation Oncology

## 2015-10-12 VITALS — BP 149/76 | HR 75 | Temp 98.2°F | Ht 60.0 in | Wt 190.1 lb

## 2015-10-12 DIAGNOSIS — C541 Malignant neoplasm of endometrium: Secondary | ICD-10-CM | POA: Insufficient documentation

## 2015-10-12 DIAGNOSIS — Z923 Personal history of irradiation: Secondary | ICD-10-CM | POA: Insufficient documentation

## 2015-10-12 DIAGNOSIS — Z79899 Other long term (current) drug therapy: Secondary | ICD-10-CM | POA: Diagnosis not present

## 2015-10-12 DIAGNOSIS — Z888 Allergy status to other drugs, medicaments and biological substances status: Secondary | ICD-10-CM | POA: Diagnosis not present

## 2015-10-12 DIAGNOSIS — IMO0002 Reserved for concepts with insufficient information to code with codable children: Secondary | ICD-10-CM

## 2015-10-12 NOTE — Progress Notes (Signed)
Radiation Oncology         [phone] ________________________________  Name: Heidi Fleming MRN: [id number]  Date: 10/12/2015  DOB: [birth year]  Follow-Up Visit Note  CC: Heidi Gravel, MD  Heidi Marus, MD  Diagnosis:  Stage IB endometrial adenocarcinoma  Interval Since Last Radiation: 3 years and 3 months  Feb. 26, March 5, on March 12, March 20, July 16, 2012: Proximal vagina, 30 Gy in 5 fractions (intracavitary brachytherapy)  Narrative:  The patient returns today for routine follow-up. The patient's last Pap smear was in 03/2015, negative for malignancy. She denies pain when sitting down, but has back pain from stenosis when standing. She denies having any bladder issues. She reports having occasional constipation. She denies having vaginal/rectal bleeding or discharge. She reports having a good appetite. She reports not having any energy and has shortness of breath with activity. She is using a vaginal dilator about once a week. Denies nausea.  ALLERGIES:  is allergic to latex.  Meds: Current Outpatient Prescriptions  Medication Sig Dispense Refill  . ALPRAZolam (XANAX) 0.5 MG tablet Take 0.5-1 mg by mouth at bedtime as needed. For sleep    . b complex vitamins tablet Take 1 tablet by mouth daily.    . calcium-vitamin D (OSCAL WITH D) 500-200 MG-UNIT per tablet Take 1 tablet by mouth daily.    . cholecalciferol (VITAMIN D) 1000 UNITS tablet Take 1,000 Units by mouth daily.    . hydrochlorothiazide (HYDRODIURIL) 25 MG tablet Take 12.5 mg by mouth every morning.     Marland Kitchen HYDROcodone-acetaminophen (VICODIN) 5-500 MG per tablet Take 1 tablet by mouth every 6 (six) hours as needed for pain. 12 tablet 0  . ibuprofen (ADVIL,MOTRIN) 200 MG tablet Take 200 mg by mouth every 6 (six) hours as needed. For pain    . omeprazole (PRILOSEC) 20 MG capsule Take 20 mg by mouth daily.    . quinapril (ACCUPRIL) 20 MG tablet Take 20 mg by mouth every morning.     . vitamin C (ASCORBIC ACID) 500 MG  tablet Take 500 mg by mouth daily.    . clotrimazole-betamethasone (LOTRISONE) cream Reported on 10/12/2015     No current facility-administered medications for this encounter.    Physical Findings: The patient is in no acute distress. Patient is alert and oriented.  height is 5' (1.524 m) and weight is 190 lb 1.6 oz (86.229 kg). Her oral temperature is 98.2 F (36.8 C). Her blood pressure is 149/76 and her pulse is 75. Her oxygen saturation is 100%.   Lungs are clear to auscultation bilaterally. Heart has regular rate and rhythm. No palpable cervical, supraclavicular, or axillary adenopathy. Abdomen soft, non-tender, normal bowel sounds.  On pelvic examination the external genitalia were unremarkable. A speculum exam was performed. There are no mucosal lesions noted in the vaginal vault. On bimanual examination, no pelvic masses were appreciated. I attempted a rectal exam, but it was too uncomfortable for the patient.  Lab Findings: Lab Results  Component Value Date   WBC 13.3* 04/08/2012   HGB 11.7* 04/08/2012   HCT 34.6* 04/08/2012   MCV 81.6 04/08/2012   PLT 249 04/08/2012      Radiographic Findings: No results found.  Impression:  No evidence of recurrence on clinical exam today.   Plan: The patient will follow up in 1 year and she will see Dr. Alycia Rossetti for a Pap smear and pelvic exam in December.  ____________________________________ -----------------------------------  Heidi Promise, PhD, MD  This document  serves as a record of services personally performed by Heidi Pray, MD. It was created on his behalf by Heidi Fleming, a trained medical scribe. The creation of this record is based on the scribe's personal observations and the provider's statements to them. This document has been checked and approved by the attending provider.

## 2015-10-12 NOTE — Progress Notes (Addendum)
Heidi Fleming is here for follow up.  She denies pain when sitting down but has back pain from stenosis when standing.  She denies having any bladder issues.  She reports having occasional constipation.  She denies having vaginal/rectal bleeding or discharge.  She reports having a good appetite.  She reports not having any energy and has shortness of breath with activity.  She is using a vaginal dilator about once a week.  BP 149/76 mmHg  Pulse 75  Temp(Src) 98.2 F (36.8 C) (Oral)  Ht 5' (1.524 m)  Wt 190 lb 1.6 oz (86.229 kg)  BMI 37.13 kg/m2  SpO2 100%   Wt Readings from Last 3 Encounters:  10/12/15 190 lb 1.6 oz (86.229 kg)  04/12/15 188 lb 9.6 oz (85.548 kg)  04/07/14 196 lb 9.6 oz (89.177 kg)

## 2015-12-15 ENCOUNTER — Other Ambulatory Visit: Payer: Self-pay

## 2016-02-15 ENCOUNTER — Other Ambulatory Visit: Payer: Self-pay | Admitting: Internal Medicine

## 2016-02-15 DIAGNOSIS — R634 Abnormal weight loss: Secondary | ICD-10-CM

## 2016-02-21 ENCOUNTER — Ambulatory Visit
Admission: RE | Admit: 2016-02-21 | Discharge: 2016-02-21 | Disposition: A | Discharge status: Home / Self Care | Payer: Medicare Other | Source: Ambulatory Visit | Attending: Internal Medicine | Admitting: Internal Medicine

## 2016-02-21 DIAGNOSIS — R634 Abnormal weight loss: Secondary | ICD-10-CM

## 2016-02-21 MED ORDER — IOPAMIDOL (ISOVUE-300) INJECTION 61%
100.0000 mL | Freq: Once | INTRAVENOUS | Status: AC | PRN
  Administered 2016-02-21: 100 mL via INTRAVENOUS

## 2016-04-10 ENCOUNTER — Ambulatory Visit: Payer: Medicare Other | Attending: Gynecologic Oncology | Admitting: Gynecologic Oncology

## 2016-04-10 ENCOUNTER — Encounter: Payer: Self-pay | Admitting: Gynecologic Oncology

## 2016-04-10 ENCOUNTER — Other Ambulatory Visit: Payer: Self-pay | Admitting: Gynecologic Oncology

## 2016-04-10 ENCOUNTER — Other Ambulatory Visit (HOSPITAL_COMMUNITY)
Admission: RE | Admit: 2016-04-10 | Discharge: 2016-04-10 | Disposition: A | Discharge status: Home / Self Care | Payer: Medicare Other | Source: Ambulatory Visit | Attending: Gynecologic Oncology | Admitting: Gynecologic Oncology

## 2016-04-10 VITALS — BP 147/81 | HR 77 | Temp 97.9°F | Resp 18 | Ht 60.0 in | Wt 179.2 lb

## 2016-04-10 DIAGNOSIS — Z8543 Personal history of malignant neoplasm of ovary: Secondary | ICD-10-CM | POA: Diagnosis not present

## 2016-04-10 DIAGNOSIS — Z9079 Acquired absence of other genital organ(s): Secondary | ICD-10-CM | POA: Insufficient documentation

## 2016-04-10 DIAGNOSIS — Z01411 Encounter for gynecological examination (general) (routine) with abnormal findings: Secondary | ICD-10-CM | POA: Diagnosis present

## 2016-04-10 DIAGNOSIS — C541 Malignant neoplasm of endometrium: Secondary | ICD-10-CM | POA: Diagnosis not present

## 2016-04-10 DIAGNOSIS — Z8542 Personal history of malignant neoplasm of other parts of uterus: Secondary | ICD-10-CM | POA: Diagnosis not present

## 2016-04-10 DIAGNOSIS — Z87891 Personal history of nicotine dependence: Secondary | ICD-10-CM | POA: Diagnosis not present

## 2016-04-10 DIAGNOSIS — Z90710 Acquired absence of both cervix and uterus: Secondary | ICD-10-CM | POA: Insufficient documentation

## 2016-04-10 DIAGNOSIS — Z90722 Acquired absence of ovaries, bilateral: Secondary | ICD-10-CM | POA: Diagnosis not present

## 2016-04-10 DIAGNOSIS — M5136 Other intervertebral disc degeneration, lumbar region: Secondary | ICD-10-CM | POA: Insufficient documentation

## 2016-04-10 DIAGNOSIS — I10 Essential (primary) hypertension: Secondary | ICD-10-CM | POA: Diagnosis not present

## 2016-04-10 DIAGNOSIS — G4730 Sleep apnea, unspecified: Secondary | ICD-10-CM | POA: Diagnosis not present

## 2016-04-10 DIAGNOSIS — K219 Gastro-esophageal reflux disease without esophagitis: Secondary | ICD-10-CM | POA: Diagnosis not present

## 2016-04-10 DIAGNOSIS — F419 Anxiety disorder, unspecified: Secondary | ICD-10-CM | POA: Diagnosis not present

## 2016-04-10 NOTE — Progress Notes (Signed)
Consult Note: Gyn-Onc  Heidi Fleming 82 y.o. female  CC:  Chief Complaint  Patient presents with  . Endometrioid carcinoma    HPI: Patient is a 81 year old gravida 4 virginal female who had been menopausal for greater than 20 years. She had been having some pink spotting for the past few months with no pain associated with it. She stated that many times it was just a pink discharge admixed with mucus. She's not sure why she delayed in getting care but ultimately went to see Dr. Dellis Filbert and had ultrasound performed showed a thick irregular endometrium of 22 mm. On February 28, 2012 she underwent dilation and curettage with hysteroscopy. Operative findings included endometrial polyps and a submucosal myoma. Pathology revealed a focal endometrioid carcinoma FIGO grade 1 arising in a background of atypical simple and complex hyperplasia. There were fragments of a leiomyoma.   She underwent a total robotic hysterectomy bilateral salpingo-oophorectomy and right pelvic lymph node sampling on March 22, 2012. Operative findings included the uterus with adhesions of rectosigmoid colon with obliteration of the cul-de-sac. Bilateral ovaries were here in to the midline. She had bilateral retroperitoneal fibrosis. On frozen section showed a grade 1 lesion with less than 50% myometrial invasion on frozen section.   Final pathology revealed a grade 1 endometrioid adenocarcinoma with 60% myometrial invasion (0.6 cm out of 1 cm myometrial invasion. She had lymphovascular space involvement present. She had stage IB disease and was dispositioned to vaginal cuff brachytherapy.  She completed vaginal cuff brachytherapy under the care of Dr. Sondra Come in March of 2014. I last saw her in Dec. 2016 at which time her exam was unremarkable as was her pap smear. She saw Dr. Sondra Come in June 2017.  Interval History:  She comes in today for follow-up. She has lost an additional 10 pounds since we saw her last year. Her primary  physician was concerned about her weight loss and actually got a CT scan which per her report was unremarkable. She has overall been fatigued and really does not feel like she has much energy she used to. In retrospect she stay she's probably losing weight because she is no longer cooking or baking is much that she does eat ice cream every evening. She was started on Prozac and is really helping her to sleep better at night. She otherwise really has no complaints she continues to have the shortness of breath with activity which is baseline for her but is not associated with any chest pain. Occasionally if she so too much she will experience some indigestion. She has had some visual changes secondary to macular changes in her left eye. First thing in the morning she does not feel that her bladder empties quite as well and she'll need to the restroom twice but otherwise denies any change about bladder habits. The remainder of her review of systems as below.  Review of Systems:  Constitutional: Feels occasionally weak and gets tired easily. Denies fever. Weight loss as above Skin: No rash Cardiovascular: No chest pain or edema, + SOB with activity Pulmonary: No cough Gastro Intestinal: No nausea, vomiting, constipation, or diarrhea reported. Genitourinary: No frequency, urgency, or dysuria.  Bladder issues in the morning as above. Denies vaginal bleeding and discharge.  Musculoskeletal: No myalgia, arthralgia, joint swelling or pain. She does complain of some low back pain due to arthritis and uses a walker or a card at the grocery store when she gets out to feel more stability.  Neurologic: Slower than  she used to be Psychology: Sleeping better with Prozac  Current Meds:  Outpatient Encounter Prescriptions as of 04/10/2016  Medication Sig Dispense Refill  . ALPRAZolam (XANAX) 0.5 MG tablet Take 0.5-1 mg by mouth at bedtime as needed. For sleep    . b complex vitamins tablet Take 1 tablet by mouth  daily.    . cholecalciferol (VITAMIN D) 1000 UNITS tablet Take 1,000 Units by mouth daily.    . clotrimazole-betamethasone (LOTRISONE) cream Reported on 10/12/2015    . FLUoxetine (PROZAC) 10 MG capsule     . hydrochlorothiazide (HYDRODIURIL) 25 MG tablet Take 12.5 mg by mouth every morning.     Marland Kitchen HYDROcodone-acetaminophen (VICODIN) 5-500 MG per tablet Take 1 tablet by mouth every 6 (six) hours as needed for pain. 12 tablet 0  . ibuprofen (ADVIL,MOTRIN) 200 MG tablet Take 200 mg by mouth every 6 (six) hours as needed. For pain    . omeprazole (PRILOSEC) 20 MG capsule Take 20 mg by mouth daily.    . quinapril (ACCUPRIL) 20 MG tablet Take 20 mg by mouth every morning.     . vitamin C (ASCORBIC ACID) 500 MG tablet Take 500 mg by mouth daily.    . calcium-vitamin D (OSCAL WITH D) 500-200 MG-UNIT per tablet Take 1 tablet by mouth daily.     No facility-administered encounter medications on file as of 04/10/2016.     Allergy:  Allergies  Allergen Reactions  . Latex Itching    Social Hx:   Social History   Social History  . Marital status: Single    Spouse name: N/A  . Number of children: N/A  . Years of education: N/A   Occupational History  . Not on file.   Social History Main Topics  . Smoking status: Former Smoker    Years: 1.00    Quit date: 04/22/1957  . Smokeless tobacco: Never Used  . Alcohol use No  . Drug use: No  . Sexual activity: Not on file   Other Topics Concern  . Not on file   Social History Narrative  . No narrative on file    Past Surgical Hx:  Past Surgical History:  Procedure Laterality Date  . CATARACT EXTRACTION     bilateral  age 62yo  . DILATATION & CURRETTAGE/HYSTEROSCOPY WITH RESECTOCOPE  02/28/2012   Procedure: [place];  Surgeon: Princess Bruins, MD;  Location: [facility];  Service: Gynecology;  Laterality: N/A;  Requests resectoscope with the loop.  Marland Kitchen DILATION AND CURETTAGE OF UTERUS    . LYMPH NODE  DISSECTION  04/07/2012   Procedure: LYMPH NODE DISSECTION;  Surgeon: Imagene Gurney A. Alycia Rossetti, MD;  Location: [facility];  Service: Gynecology;  Laterality: Right;  pelvic  . ROBOTIC ASSISTED TOTAL HYSTERECTOMY WITH BILATERAL SALPINGO OOPHERECTOMY  04/07/2012   Procedure: ROBOTIC ASSISTED TOTAL HYSTERECTOMY WITH BILATERAL SALPINGO OOPHORECTOMY;  Surgeon: Imagene Gurney A. Alycia Rossetti, MD;  Location: [facility];  Service: Gynecology;  Laterality: N/A;    Past Medical Hx:  Past Medical History:  Diagnosis Date  . Anxiety    regarding surgery  . Arthritis   . Cancer [facility])    endometrial carcinoma  . DDD (degenerative disc disease), lumbar   . Dysrhythmia    "irregular in the past"- off meds  . 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 GERD (gastroesophageal reflux disease)   . History of radiation therapy 06/17/12, 06/24/2012, 07/01/2012, 07/16/2012   30 gy proximal vagina  . Hypertension   . Irregular uterine bleeding   .  Sleep apnea    CPAP  . Uterine polyp     Oncology Hx:    Endometrioid carcinoma   02/28/2012 Surgery    D&C for diagnosis of a grade I endometrial cancer      04/07/2012 Surgery    robotic hystercomy, BSO, right pelvic LND. IB (60%) grade I endometrial cacner, + LVSI       - 07/17/2012 Radiation Therapy    Completed vaginal brachytherapy       Family Hx:  Family History  Problem Relation Age of Onset  . Diabetes Father   . Heart disease Sister   . Hypertension Sister   . Heart disease Brother   . Cancer Brother     Vitals:  Blood pressure (!) 147/81, pulse 77, temperature 97.9 F (36.6 C), temperature source Oral, resp. rate 18, height 5' (1.524 m), weight 179 lb 3.2 oz (81.3 kg), SpO2 100 %.  Physical Exam: Well-nourished well-developed female in no acute distress.   Neck: Supple, no lymphadenopathy no thyromegaly.  Lungs: Clear to auscultation bilaterally.  Cardiovascular: Regular rate and rhythm.  Abdomen: Well-healed surgical incisions. No evidence of an incisional hernias.  Abdomen soft, nontender, nondistended. Exam is somewhat limited by habitus.  Extremities: 1+ pitting edema equal bilaterally. Changes consistent with osteoarthritis in her hands.  Groins: Negative for adenopathy.  Pelvic: Normal external female genitalia. Markedly atrophic and narrow vagina. There are no visible lesions. Vaginal cuff without lesions, pap smear submitted.  Bimanual examination reveals no masses or nodularity. Rectal confirms.   Assessment/Plan: 82 year old gravida 0 virginal female who's been menopausal for 20 years and has stage IB grade 1 endometrial carcinoma diagnosed in 12/13. She did have myometrial invasion of 60% and lymphovascular space involvement. She completed vaginal cuff brachytherapy with Dr. Sondra Come in March of 2014. Her exam today is unremarkable. She'll see Dr. Sondra Come in 6 months and return to see [place] in 12. We will follow up on the results of her pap smear from today.  Taijon Vink A., MD 04/10/2016, 1:55 PM

## 2016-04-10 NOTE — Patient Instructions (Signed)
We will notify you of the results of your Pap smear from today. Please follow-up with Dr. Sondra Come in 6 months and Dr. Alycia Rossetti in one year.

## 2016-04-18 ENCOUNTER — Telehealth: Payer: Self-pay | Admitting: Gynecologic Oncology

## 2016-04-18 NOTE — Telephone Encounter (Signed)
Message left for patient with pap smear results: negative.  Instructed to call for any questions or concerns.  

## 2016-04-22 LAB — CYTOLOGY - PAP

## 2016-10-10 ENCOUNTER — Encounter: Payer: Self-pay | Admitting: Radiation Oncology

## 2016-10-10 ENCOUNTER — Ambulatory Visit
Admission: RE | Admit: 2016-10-10 | Discharge: 2016-10-10 | Disposition: A | Discharge status: Home / Self Care | Payer: Medicare Other | Source: Ambulatory Visit | Attending: Radiation Oncology | Admitting: Radiation Oncology

## 2016-10-10 VITALS — BP 155/82 | HR 69 | Temp 98.9°F | Ht 60.0 in | Wt 171.2 lb

## 2016-10-10 DIAGNOSIS — Z8541 Personal history of malignant neoplasm of cervix uteri: Secondary | ICD-10-CM | POA: Diagnosis not present

## 2016-10-10 DIAGNOSIS — Z91040 Latex allergy status: Secondary | ICD-10-CM | POA: Diagnosis not present

## 2016-10-10 DIAGNOSIS — C541 Malignant neoplasm of endometrium: Secondary | ICD-10-CM | POA: Diagnosis present

## 2016-10-10 DIAGNOSIS — Z923 Personal history of irradiation: Secondary | ICD-10-CM | POA: Diagnosis not present

## 2016-10-10 NOTE — Progress Notes (Signed)
Radiation Oncology         [phone] ________________________________  Name: Heidi Fleming MRN: [id number]  Date: 10/10/2016  DOB: 22-Aug-1930  Follow-Up Visit Note  CC: [name], MD  Nancy Marus, MD  Diagnosis:  Stage IB endometrial adenocarcinoma  Interval Since Last Radiation: 4 years and 3 months  Feb. 26, March 5, on March 12, March 20, July 16, 2012: Proximal vagina, 30 Gy in 5 fractions (intracavitary brachytherapy)  Narrative:  The patient returns today for routine follow-up. She reports having pain in her lower back from spinal stinosis with standing.  She also reports having "unsteadiness" which seems to be getting worse.  She is using a walker today, and has been using it for a few months.  She reports having occasional stress incontinence.  She reports having occasional constipation.  She denies having any vaginal bleeding or discharge.  She reports having fatigue.  She is using a vaginal dilator once a week.   ALLERGIES:  is allergic to latex.  Meds: Current Outpatient Prescriptions  Medication Sig Dispense Refill  . ALPRAZolam (XANAX) 0.5 MG tablet Take 0.5-1 mg by mouth at bedtime as needed. For sleep    . calcium-vitamin D (OSCAL WITH D) 500-200 MG-UNIT per tablet Take 1 tablet by mouth daily.    . cholecalciferol (VITAMIN D) 1000 UNITS tablet Take 1,000 Units by mouth daily.    . hydrochlorothiazide (HYDRODIURIL) 25 MG tablet Take 12.5 mg by mouth every morning.     Marland Kitchen HYDROcodone-acetaminophen (VICODIN) 5-500 MG per tablet Take 1 tablet by mouth every 6 (six) hours as needed for pain. 12 tablet 0  . ibuprofen (ADVIL,MOTRIN) 200 MG tablet Take 200 mg by mouth every 6 (six) hours as needed. For pain    . omeprazole (PRILOSEC) 20 MG capsule Take 20 mg by mouth daily.    . quinapril (ACCUPRIL) 20 MG tablet Take 20 mg by mouth every morning.     . vitamin C (ASCORBIC ACID) 500 MG tablet Take 500 mg by mouth daily.    Marland Kitchen b complex vitamins tablet Take 1 tablet by  mouth daily.    . clotrimazole-betamethasone (LOTRISONE) cream Reported on 10/12/2015    . FLUoxetine (PROZAC) 10 MG capsule      No current facility-administered medications for this encounter.     Physical Findings: The patient is in no acute distress. Patient is alert and oriented.  height is 5' (1.524 m) and weight is 171 lb 3.2 oz (77.7 kg). Her oral temperature is 98.9 F (37.2 C). Her blood pressure is 155/82 (abnormal) and her pulse is 69. Her oxygen saturation is 96%.   Lungs are clear to auscultation bilaterally. Heart has regular rate and rhythm. No palpable cervical, supraclavicular, or axillary adenopathy. Abdomen soft, non-tender, normal bowel sounds.   On pelvic examination the external genitalia were unremarkable. Was unable to place a small speculum due to the narrow vaginal vault. Digital exam of vaginal cuff on bimanual exam revealed no palpable masses.  Lab Findings: Lab Results  Component Value Date   WBC 13.3 (H) 04/08/2012   HGB 11.7 (L) 04/08/2012   HCT 34.6 (L) 04/08/2012   MCV 81.6 04/08/2012   PLT 249 04/08/2012      Radiographic Findings: No results found.  Impression: Stage IB endometrial adenocarcinoma. No evidence of recurrence on somewhat limited clinical exam today.   Plan:  The patient will see Dr. Alycia Rossetti in 6 months and f/u in 1 year in radiation oncology. That will  be her last f/u appointment in light of the interval since treatment.  ____________________________________ -----------------------------------  Blair Promise, PhD, MD  This document serves as a record of services personally performed by Gery Pray, MD. It was created on his behalf by Linward Natal, a trained medical scribe. The creation of this record is based on the scribe's personal observations and the provider's statements to them. This document has been checked and approved by the attending provider.

## 2016-10-10 NOTE — Progress Notes (Signed)
Heidi Fleming is here for follow up.  She reports having pain in her lower back from spinal stinosis with standing.  She also reports having "unsteadiness" which seems to be getting worse.  She is using a walker today.  She reports having occasional stress incontinence.  She reports having occasional constipation.  She denies having any vaginal bleeding or discharge.  She reports having fatigue.  She is using a vaginal dilator once a week.  BP (!) 155/82 (BP Location: Right Arm, Patient Position: Sitting)   Pulse 69   Temp 98.9 F (37.2 C) (Oral)   Ht 5' (1.524 m)   Wt 171 lb 3.2 oz (77.7 kg)   SpO2 96%   BMI 33.44 kg/m    Wt Readings from Last 3 Encounters:  10/10/16 171 lb 3.2 oz (77.7 kg)  04/10/16 179 lb 3.2 oz (81.3 kg)  10/12/15 190 lb 1.6 oz (86.2 kg)

## 2016-12-18 ENCOUNTER — Telehealth: Payer: Self-pay | Admitting: *Deleted

## 2016-12-18 NOTE — Telephone Encounter (Signed)
Returned patient's call and scheduled a follow up appt for December 12th at 3pm.

## 2017-04-02 ENCOUNTER — Ambulatory Visit: Payer: Medicare Other | Admitting: Gynecologic Oncology

## 2017-04-21 ENCOUNTER — Telehealth: Payer: Self-pay | Admitting: *Deleted

## 2017-04-21 NOTE — Telephone Encounter (Signed)
Patient called and canceled her appt for January 2nd, will call bck to reschedule

## 2017-04-23 ENCOUNTER — Ambulatory Visit: Payer: Medicare Other | Admitting: Gynecologic Oncology

## 2017-05-21 ENCOUNTER — Telehealth: Payer: Self-pay | Admitting: *Deleted

## 2017-05-21 NOTE — Telephone Encounter (Signed)
Returned the patient's and advised her that per [facility] APP, she doesn't need to come to see [place]; she can see Dr. Sondra Come

## 2017-05-29 ENCOUNTER — Encounter (INDEPENDENT_AMBULATORY_CARE_PROVIDER_SITE_OTHER): Payer: Medicare HMO | Admitting: Ophthalmology

## 2017-05-29 DIAGNOSIS — H35373 Puckering of macula, bilateral: Secondary | ICD-10-CM

## 2017-05-29 DIAGNOSIS — H33301 Unspecified retinal break, right eye: Secondary | ICD-10-CM | POA: Diagnosis not present

## 2017-05-29 DIAGNOSIS — H35033 Hypertensive retinopathy, bilateral: Secondary | ICD-10-CM | POA: Diagnosis not present

## 2017-05-29 DIAGNOSIS — H43813 Vitreous degeneration, bilateral: Secondary | ICD-10-CM

## 2017-05-29 DIAGNOSIS — I10 Essential (primary) hypertension: Secondary | ICD-10-CM

## 2017-07-28 DIAGNOSIS — H6123 Impacted cerumen, bilateral: Secondary | ICD-10-CM | POA: Diagnosis not present

## 2017-07-28 DIAGNOSIS — J3489 Other specified disorders of nose and nasal sinuses: Secondary | ICD-10-CM | POA: Diagnosis not present

## 2017-07-28 DIAGNOSIS — J342 Deviated nasal septum: Secondary | ICD-10-CM | POA: Diagnosis not present

## 2017-07-28 DIAGNOSIS — H9312 Tinnitus, left ear: Secondary | ICD-10-CM | POA: Diagnosis not present

## 2017-07-28 DIAGNOSIS — H903 Sensorineural hearing loss, bilateral: Secondary | ICD-10-CM | POA: Diagnosis not present

## 2017-09-02 DIAGNOSIS — H5213 Myopia, bilateral: Secondary | ICD-10-CM | POA: Diagnosis not present

## 2017-09-02 DIAGNOSIS — Z0100 Encounter for examination of eyes and vision without abnormal findings: Secondary | ICD-10-CM | POA: Diagnosis not present

## 2017-09-03 DIAGNOSIS — R69 Illness, unspecified: Secondary | ICD-10-CM | POA: Diagnosis not present

## 2017-09-10 DIAGNOSIS — I10 Essential (primary) hypertension: Secondary | ICD-10-CM | POA: Diagnosis not present

## 2017-09-10 DIAGNOSIS — G4700 Insomnia, unspecified: Secondary | ICD-10-CM | POA: Diagnosis not present

## 2017-09-10 DIAGNOSIS — Z79891 Long term (current) use of opiate analgesic: Secondary | ICD-10-CM | POA: Diagnosis not present

## 2017-09-10 DIAGNOSIS — Z6835 Body mass index (BMI) 35.0-35.9, adult: Secondary | ICD-10-CM | POA: Diagnosis not present

## 2017-09-10 DIAGNOSIS — K219 Gastro-esophageal reflux disease without esophagitis: Secondary | ICD-10-CM | POA: Diagnosis not present

## 2017-09-10 DIAGNOSIS — Z791 Long term (current) use of non-steroidal anti-inflammatories (NSAID): Secondary | ICD-10-CM | POA: Diagnosis not present

## 2017-09-10 DIAGNOSIS — Z808 Family history of malignant neoplasm of other organs or systems: Secondary | ICD-10-CM | POA: Diagnosis not present

## 2017-09-10 DIAGNOSIS — G8929 Other chronic pain: Secondary | ICD-10-CM | POA: Diagnosis not present

## 2017-09-10 DIAGNOSIS — R269 Unspecified abnormalities of gait and mobility: Secondary | ICD-10-CM | POA: Diagnosis not present

## 2017-09-11 DIAGNOSIS — E7800 Pure hypercholesterolemia, unspecified: Secondary | ICD-10-CM | POA: Diagnosis not present

## 2017-09-11 DIAGNOSIS — I10 Essential (primary) hypertension: Secondary | ICD-10-CM | POA: Diagnosis not present

## 2017-09-11 DIAGNOSIS — M545 Low back pain: Secondary | ICD-10-CM | POA: Diagnosis not present

## 2017-09-11 DIAGNOSIS — Z0000 Encounter for general adult medical examination without abnormal findings: Secondary | ICD-10-CM | POA: Diagnosis not present

## 2017-09-11 DIAGNOSIS — R202 Paresthesia of skin: Secondary | ICD-10-CM | POA: Diagnosis not present

## 2017-09-11 DIAGNOSIS — Z5181 Encounter for therapeutic drug level monitoring: Secondary | ICD-10-CM | POA: Diagnosis not present

## 2017-09-18 DIAGNOSIS — R0609 Other forms of dyspnea: Secondary | ICD-10-CM | POA: Diagnosis not present

## 2017-09-18 DIAGNOSIS — M549 Dorsalgia, unspecified: Secondary | ICD-10-CM | POA: Diagnosis not present

## 2017-09-18 DIAGNOSIS — G4733 Obstructive sleep apnea (adult) (pediatric): Secondary | ICD-10-CM | POA: Diagnosis not present

## 2017-09-18 DIAGNOSIS — E785 Hyperlipidemia, unspecified: Secondary | ICD-10-CM | POA: Diagnosis not present

## 2017-09-18 DIAGNOSIS — Z0000 Encounter for general adult medical examination without abnormal findings: Secondary | ICD-10-CM | POA: Diagnosis not present

## 2017-09-18 DIAGNOSIS — I10 Essential (primary) hypertension: Secondary | ICD-10-CM | POA: Diagnosis not present

## 2017-09-18 DIAGNOSIS — Z79899 Other long term (current) drug therapy: Secondary | ICD-10-CM | POA: Diagnosis not present

## 2017-09-18 DIAGNOSIS — J342 Deviated nasal septum: Secondary | ICD-10-CM | POA: Diagnosis not present

## 2017-09-24 DIAGNOSIS — R69 Illness, unspecified: Secondary | ICD-10-CM | POA: Diagnosis not present

## 2017-10-02 DIAGNOSIS — R0609 Other forms of dyspnea: Secondary | ICD-10-CM | POA: Diagnosis not present

## 2017-10-02 DIAGNOSIS — R002 Palpitations: Secondary | ICD-10-CM | POA: Diagnosis not present

## 2017-10-02 DIAGNOSIS — I10 Essential (primary) hypertension: Secondary | ICD-10-CM | POA: Diagnosis not present

## 2017-10-02 DIAGNOSIS — E7800 Pure hypercholesterolemia, unspecified: Secondary | ICD-10-CM | POA: Diagnosis not present

## 2017-10-02 DIAGNOSIS — R0989 Other specified symptoms and signs involving the circulatory and respiratory systems: Secondary | ICD-10-CM | POA: Diagnosis not present

## 2017-10-13 ENCOUNTER — Ambulatory Visit: Payer: Self-pay | Admitting: Radiation Oncology

## 2017-10-15 DIAGNOSIS — R002 Palpitations: Secondary | ICD-10-CM | POA: Diagnosis not present

## 2017-11-05 DIAGNOSIS — R0609 Other forms of dyspnea: Secondary | ICD-10-CM | POA: Diagnosis not present

## 2017-11-05 DIAGNOSIS — R0989 Other specified symptoms and signs involving the circulatory and respiratory systems: Secondary | ICD-10-CM | POA: Diagnosis not present

## 2017-11-07 DIAGNOSIS — R002 Palpitations: Secondary | ICD-10-CM | POA: Diagnosis not present

## 2017-11-07 DIAGNOSIS — R0609 Other forms of dyspnea: Secondary | ICD-10-CM | POA: Diagnosis not present

## 2017-11-07 DIAGNOSIS — R0602 Shortness of breath: Secondary | ICD-10-CM | POA: Diagnosis not present

## 2017-11-07 DIAGNOSIS — I10 Essential (primary) hypertension: Secondary | ICD-10-CM | POA: Diagnosis not present

## 2017-11-13 ENCOUNTER — Encounter: Payer: Self-pay | Admitting: Neurology

## 2017-11-18 ENCOUNTER — Encounter: Payer: Self-pay | Admitting: Neurology

## 2017-11-18 ENCOUNTER — Ambulatory Visit: Payer: Medicare HMO | Admitting: Neurology

## 2017-11-18 VITALS — BP 169/83 | HR 70 | Ht 60.0 in | Wt 179.0 lb

## 2017-11-18 DIAGNOSIS — G4721 Circadian rhythm sleep disorder, delayed sleep phase type: Secondary | ICD-10-CM

## 2017-11-18 DIAGNOSIS — G4733 Obstructive sleep apnea (adult) (pediatric): Secondary | ICD-10-CM | POA: Diagnosis not present

## 2017-11-18 DIAGNOSIS — R69 Illness, unspecified: Secondary | ICD-10-CM | POA: Diagnosis not present

## 2017-11-18 DIAGNOSIS — F5102 Adjustment insomnia: Secondary | ICD-10-CM

## 2017-11-18 DIAGNOSIS — F1320 Sedative, hypnotic or anxiolytic dependence, uncomplicated: Secondary | ICD-10-CM | POA: Diagnosis not present

## 2017-11-18 DIAGNOSIS — F329 Major depressive disorder, single episode, unspecified: Secondary | ICD-10-CM | POA: Diagnosis not present

## 2017-11-18 DIAGNOSIS — F11282 Opioid dependence with opioid-induced sleep disorder: Secondary | ICD-10-CM

## 2017-11-18 DIAGNOSIS — F32A Depression, unspecified: Secondary | ICD-10-CM

## 2017-11-18 NOTE — Progress Notes (Signed)
SLEEP MEDICINE CLINIC   Provider:  Larey Seat, M D  Primary Care Physician:  [name], MD   Referring Provider: [name], MD    Chief Complaint  Patient presents with  . New Patient (Initial Visit)    pt alone, rm 10. pt states that she has been unable to use the machine for over a year. DME [facility] patient. pt states that she tried using it but was never able to get a mask to fit right. she got tired of it not working right and feeling like it was keeping her up at night time. sleep study     HPI:  Heidi Fleming is a 84 y.o. female , seen here in a referral from Dr. Maudie Fleming for transfer of Sleep Care.  The patient used the current CPAP since 2012- CPAP is her first. Her sleep study was done at Dr. Roanna Fleming in [place].    Chief complaint according to patient : " The patient stated that Dr. Maudie Fleming had mentioned several times that he would like her to be reevaluated for sleep apnea.  She had a sleep study as she thought about 10 years ago she finally started on CPAP her current machine is issued in 2012, and she was using it but only with difficulties.  She gets frustrated feeling that the machine kept her up at night rather than helping her to sleep better.  There have also been several other medical conditions that have developed in the interim.  The patient has hypertension, she denies depression but has some anxiety, she has chronic back pain which has been followed she is currently walking with a walker she has eczema.  She has dyspnea with exertion for the last 6 months or so sometimes cardiac palpitation.  She has seen Dr. Einar Fleming as a cardiologist. She was referred back to Dr. Einar Fleming as well as for sleep care transferred to me.  Sleep habits are as follows: Mrs. Heidi Fleming reports that her mealtime her bedtime all have shifted to work later hours.  Often she has dinner as late as 8 or 9 pm, she watches TV for hours and does crossword puzzles, likes to read.  She usually goes to bed  between midnight and 1 AM, she sleeps on her side, usually on 2 pillows. Her bedroom is cool, quiet and dark- and she has no trouble to go to sleep -She reports no difficulties with breathing when she lies flat but it is more comfortable for back pain. She usually wakes up and is not sure what triggered it but uses the arousal to go to the bathroom.  Usually she has not to go more than twice at night.  Always wakes up at least once at night and she is not sure what triggers these arousals.  Unfortunately it means that she stays awake for an hour or even a couple of hours before continuing to sleep.  This pattern has been present for several years., causes her to sleep late.  She rises at variable times 7 or 11 AM- just depends on how she feels-  she feels initially not as refreshed and restored, she begins to feel more alert as the day goes on a couple of hours.   Sleep medical history and family sleep history:  Spinal stenosis, gait disorder, back pain, hypertension, history of migraine headaches, she had previously been evaluated for carotid stenosis in the year 2011 returned negative, cardiac echo in 2012 results not available, history of acid  reflux-GERD, osteopenia sciatica degenerative changes in the lower spine known since imaging studies from 2013, endometrial adenocarcinoma 2014 stage I, she underwent a robotic assisted laparoscopic hysterectomy in December 2014.  She also had a tubovillious adenoma with high-grade dysplasia found in a colonoscopy in 2007.   Social history:  Used to Armed forces operational officer. Retired. No children, single, non smoker - quit in 1955. No history of shift work. She is retired and lives alone.  She has been retired for 22 years but worked part-time until 2007. Drinking seldomly alcohol, caffeine 1 cup of coffee in AM, rare iced tea when eating out, and no sodas.    Review of Systems: Out of a complete 14 system review, the patient complains of only the following symptoms, and  all other reviewed systems are negative.  I am not sleeping through- and had apnea - could this be the cause. No naps !   Epworth score 3 , Fatigue severity score 47  , depression score 1/ 15    Social History   Socioeconomic History  . Marital status: Single    Spouse name: Not on file  . Number of children: Not on file  . Years of education: Not on file  . Highest education level: Not on file  Occupational History  . Not on file  Social Needs  . Financial resource strain: Not on file  . Food insecurity:    Worry: Not on file    Inability: Not on file  . Transportation needs:    Medical: Not on file    Non-medical: Not on file  Tobacco Use  . Smoking status: Former Smoker    Years: 1.00    Last attempt to quit: 04/22/1957    Years since quitting: 60.6  . Smokeless tobacco: Never Used  Substance and Sexual Activity  . Alcohol use: No  . Drug use: No  . Sexual activity: Not on file  Lifestyle  . Physical activity:    Days per week: Not on file    Minutes per session: Not on file  . Stress: Not on file  Relationships  . Social connections:    Talks on phone: Not on file    Gets together: Not on file    Attends religious service: Not on file    Active member of club or organization: Not on file    Attends meetings of clubs or organizations: Not on file    Relationship status: Not on file  . Intimate partner violence:    Fear of current or ex partner: Not on file    Emotionally abused: Not on file    Physically abused: Not on file    Forced sexual activity: Not on file  Other Topics Concern  . Not on file  Social History Narrative  . Not on file    Family History  Problem Relation Age of Onset  . Diabetes Father   . Heart disease Sister   . Hypertension Sister   . Heart disease Brother   . Cancer Brother   . Stroke Mother   . Asthma Mother     Past Medical History:  Diagnosis Date  . Anxiety    regarding surgery  . Arthritis   . Cancer [facility])     endometrial carcinoma  . DDD (degenerative disc disease), lumbar   . Dysrhythmia    "irregular in the past"- off meds  . 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 GERD (gastroesophageal reflux disease)   . Headache   .  History of radiation therapy 06/17/12, 06/24/2012, 07/01/2012, 07/16/2012   30 gy proximal vagina  . Hypertension   . Irregular uterine bleeding   . Sleep apnea    CPAP  . Uterine polyp     Past Surgical History:  Procedure Laterality Date  . CATARACT EXTRACTION     bilateral  age 44yo  . COLONOSCOPY    . DILATATION & CURRETTAGE/HYSTEROSCOPY WITH RESECTOCOPE  02/28/2012   Procedure: [place];  Surgeon: Princess Bruins, MD;  Location: [facility];  Service: Gynecology;  Laterality: N/A;  Requests resectoscope with the loop.  Marland Kitchen DILATION AND CURETTAGE OF UTERUS    . LYMPH NODE DISSECTION  04/07/2012   Procedure: LYMPH NODE DISSECTION;  Surgeon: Imagene Gurney A. Alycia Rossetti, MD;  Location: [facility];  Service: Gynecology;  Laterality: Right;  pelvic  . ROBOTIC ASSISTED TOTAL HYSTERECTOMY WITH BILATERAL SALPINGO OOPHERECTOMY  04/07/2012   Procedure: ROBOTIC ASSISTED TOTAL HYSTERECTOMY WITH BILATERAL SALPINGO OOPHORECTOMY;  Surgeon: Imagene Gurney A. Alycia Rossetti, MD;  Location: [facility];  Service: Gynecology;  Laterality: N/A;    Current Outpatient Medications  Medication Sig Dispense Refill  . ALPRAZolam (XANAX) 0.5 MG tablet Take 0.5-1 mg by mouth at bedtime as needed. For sleep    . b complex vitamins tablet Take 1 tablet by mouth daily.    . calcium-vitamin D (OSCAL WITH D) 500-200 MG-UNIT per tablet Take 1 tablet by mouth daily.    . cholecalciferol (VITAMIN D) 1000 UNITS tablet Take 1,000 Units by mouth daily.    . clotrimazole-betamethasone (LOTRISONE) cream Reported on 10/12/2015    . FLUoxetine (PROZAC) 10 MG capsule     . hydrochlorothiazide (HYDRODIURIL) 25 MG tablet Take 12.5 mg by mouth every morning.     Marland Kitchen HYDROcodone-acetaminophen (VICODIN) 5-500 MG per tablet  Take 1 tablet by mouth every 6 (six) hours as needed for pain. 12 tablet 0  . ibuprofen (ADVIL,MOTRIN) 200 MG tablet Take 200 mg by mouth every 6 (six) hours as needed. For pain    . omeprazole (PRILOSEC) 20 MG capsule Take 20 mg by mouth daily.    . quinapril (ACCUPRIL) 20 MG tablet Take 20 mg by mouth every morning.     . vitamin C (ASCORBIC ACID) 500 MG tablet Take 500 mg by mouth daily.     No current facility-administered medications for this visit.     Allergies as of 11/18/2017 - Review Complete 11/18/2017  Allergen Reaction Noted  . Latex Itching 05/14/2012    Vitals: BP (!) 169/83   Pulse 70   Ht 5' (1.524 m)   Wt 179 lb (81.2 kg)   BMI 34.96 kg/m  Last Weight:  Wt Readings from Last 1 Encounters:  11/18/17 179 lb (81.2 kg)   EEF:EOFH mass index is 34.96 kg/m.     Last Height:   Ht Readings from Last 1 Encounters:  11/18/17 5' (1.524 m)    Physical exam:  General: The patient is awake, alert and appears not in acute distress. The patient is well groomed. Head: Normocephalic, atraumatic. Neck is supple. Mallampati 3,  neck circumference:15'. Nasal airflow patent , hoarseness-   Retrognathia is seen. Partial upper dentures.  Cardiovascular:  Regular rate and rhythm , without  murmurs or carotid bruit, and without distended neck veins. Respiratory: Lungs are clear to auscultation. Skin:  Without evidence of edema, or rash Trunk: BMI is 35. The patient's posture is stooped,   Neurologic exam : The patient is awake and alert, oriented to place and  time.    Attention span & concentration ability appears normal.  Speech is fluent,  with dysphonia .  Mood and affect are appropriate.  Cranial nerves: Pupils are equal and briskly reactive to light. Funduscopic exam deferred. Extraocular movements  in vertical and horizontal planes intact and without nystagmus. Visual fields by finger perimetry are intact. Hearing to finger rub intact.   Facial sensation intact to fine  touch.  Facial motor strength is symmetric and tongue and uvula move midline. Shoulder shrug was symmetrical.   Motor exam:   Normal tone, muscle bulk and symmetric strength in upper extremities. Lost some grip strength .  she has balance problems,   Sensory:  Fine touch, pinprick and vibration were tested in all extremities. Decreased vibration and fine touch - has ankle edema.  Upper right extremity numb patch on antebrachium, stops at the wrist, ulnar nerve - Proprioception tested in the upper extremities was normal.  Coordination: Rapid alternating movements in the fingers/hands was normal. Finger-to-nose maneuver  normal without evidence of ataxia, dysmetria or tremor.  Gait and station: Patient walks witth assistive device - walker for several years- diagnosed with spinal stenosis.  Her back pain and leg weakness forces her to sit down she cannot stand for very long time, when she sits however she is pain-free.  Deep tendon reflexes: in the upper and lower extremities are symmetric and intact, with  2 plus patella reflex-    Assessment:  After physical and neurologic examination, review of laboratory studies,  Personal review of imaging studies, reports of other /same  Imaging studies, results of polysomnography and / or neurophysiology testing and pre-existing records as far as provided in visit., my assessment is   1)   I do not know how severe Mrs. Safley is baseline sleep apnea was, however it is unlikely that she would have lost her sleep apnea over the last 7 years knowing that she has less muscle tone and less muscle strength overall, and there has been not that significant weight loss in that interim.  2) since she lives alone and does not have to adhere to any work related time structures, it has allowed her to push out her sleep time and also to stay in bed longer if she desires.  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  This is acquired and it does not  bother her to be asleep in early morning time and sleep until noon, what bothers her is the interruption and fragmentation of her sleep.  The patient was advised of the nature of the diagnosed disorder , the treatment options and the  risks for general health and wellness arising from not treating the condition.   I spent more than 50 minutes of face to face time with the patient.  Greater than 50% of time was spent in counseling and coordination of care. We have discussed the diagnosis and differential and I answered the patient's questions.    Plan:  Treatment plan and additional workup :  There are many conditions and medical comorbidities that could contribute to a very high fatigue severity score of 47 out of 63 points, however she is not excessively daytime sleepy and she does not struggle in daytime with the irresistible urge to go to sleep.  It is more a lack of motivation not finding the strength to do certain activities and there may be a component of depression.  I would like to order a sleep study  for the patient since she resides in [place] the patient's new Medicare plan is for his apnea and requires her to pay 20% of the cost, based on this she would prefer as a cheaper version of the home sleep test.  She will arrange to have transportation.    Larey Seat, MD 12/30/3110, 1:62 PM  Certified in Neurology by ABPN Certified in [place] by [facility] [address], [place], [place] [address]

## 2017-11-20 DIAGNOSIS — I771 Stricture of artery: Secondary | ICD-10-CM | POA: Diagnosis not present

## 2017-11-20 DIAGNOSIS — R0989 Other specified symptoms and signs involving the circulatory and respiratory systems: Secondary | ICD-10-CM | POA: Diagnosis not present

## 2017-11-20 DIAGNOSIS — I10 Essential (primary) hypertension: Secondary | ICD-10-CM | POA: Diagnosis not present

## 2017-11-20 DIAGNOSIS — R0609 Other forms of dyspnea: Secondary | ICD-10-CM | POA: Diagnosis not present

## 2017-11-25 IMAGING — CT CT ABD-PELV W/ CM
2 of 5 series · 11 of 36 positions shown, 18 images · IV contrast (READICAT/WATER & [ID] ISOVUE 300)
Comparison: Chest CT 12/26/2010

CLINICAL DATA: 10 pound weight loss over the past 6 months.
Fatigue. History of endometrial cancer status post surgery and
radiation.

Creatinine was obtained on site at [HOSPITAL] at [REDACTED].Results: Creatinine 0.9 mg/dL.
EXAM:
CT CHEST, ABDOMEN, AND PELVIS WITH CONTRAST
TECHNIQUE: Multidetector CT imaging of the chest, abdomen and pelvis was
performed following the standard protocol during bolus
administration of intravenous contrast.
CONTRAST:  100mL [id number]-BYY IOPAMIDOL ([id number]-BYY) INJECTION 61%

[Series 601: coronal body · coronal · 1.17mm/px · 1 of 126 slices shown, 2 images]
[im 42/126  soft-tissue]
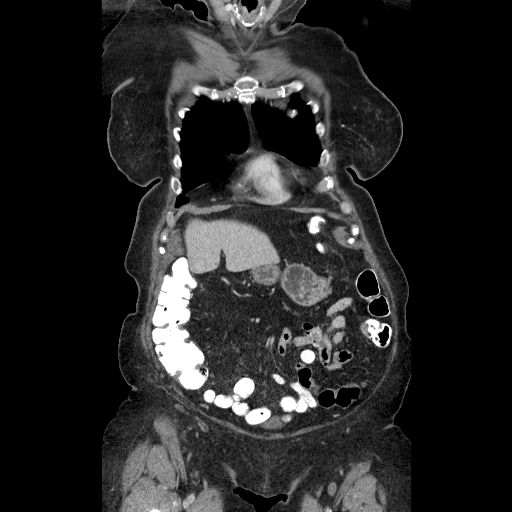
[im 42/126  bone]
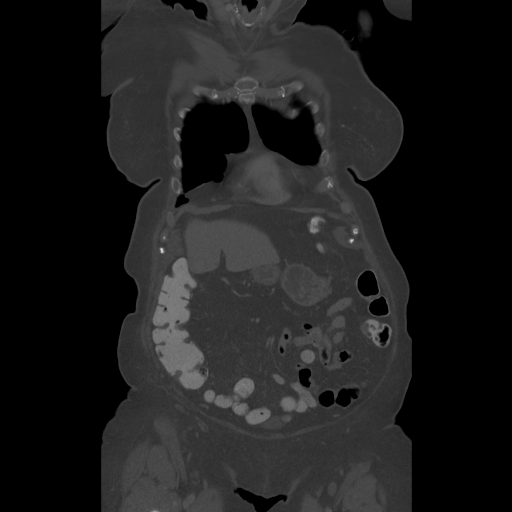

[Series 602: sagittal body · sagittal · 1.17mm/px · 10 of 153 slices shown, 16 images]
[im 14/153  soft-tissue]
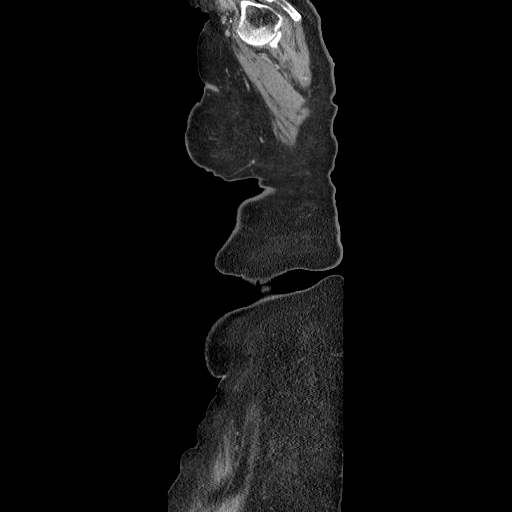
[im 14/153  lung]
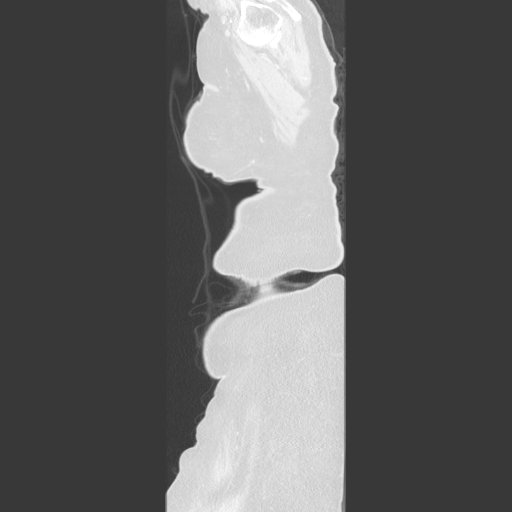
[im 14/153  bone]
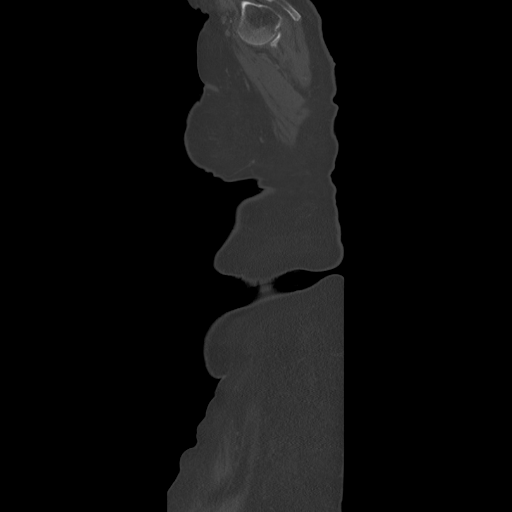
[im 28/153  soft-tissue]
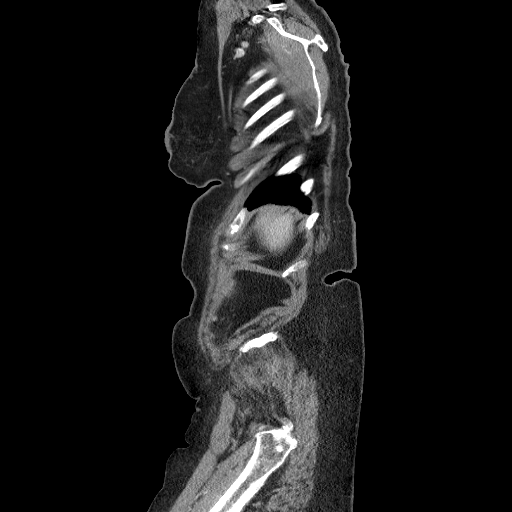
[im 28/153  lung]
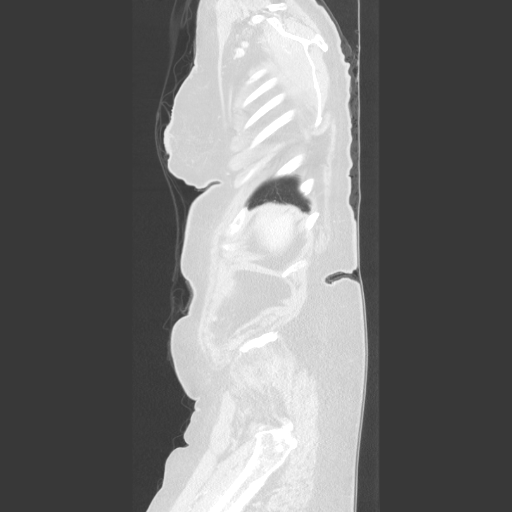
[im 42/153  soft-tissue]
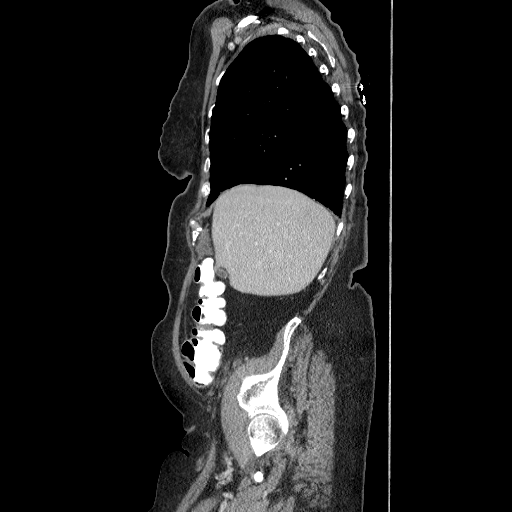
[im 42/153  lung]
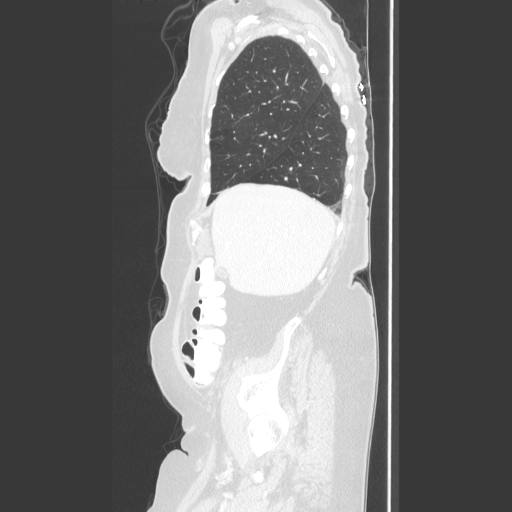
[im 56/153  soft-tissue]
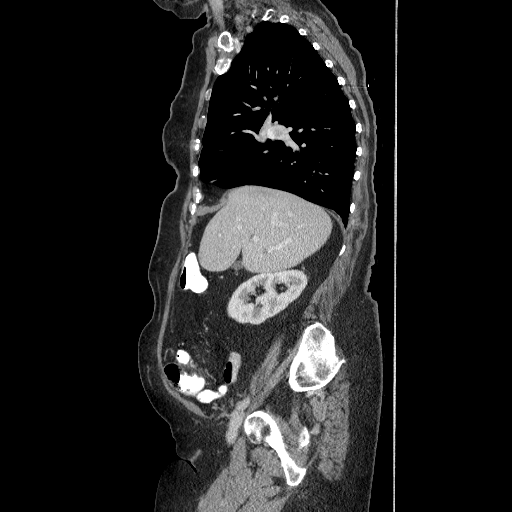
[im 56/153  lung]
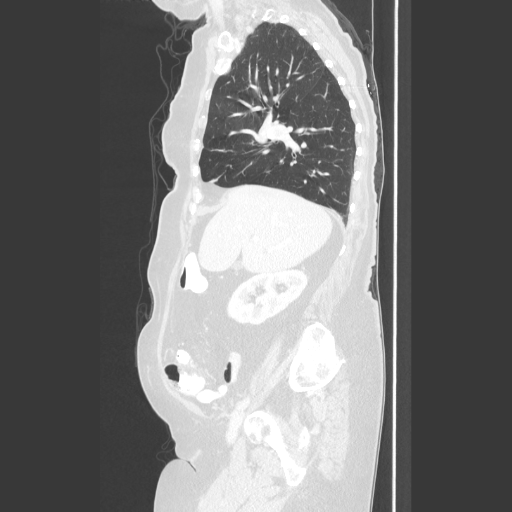
[im 70/153  soft-tissue]
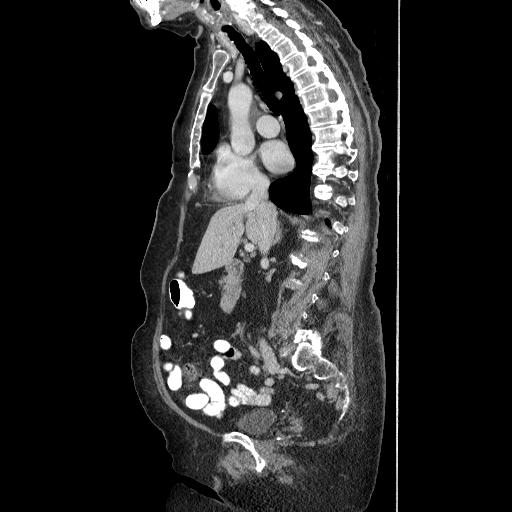
[im 83/153  soft-tissue]
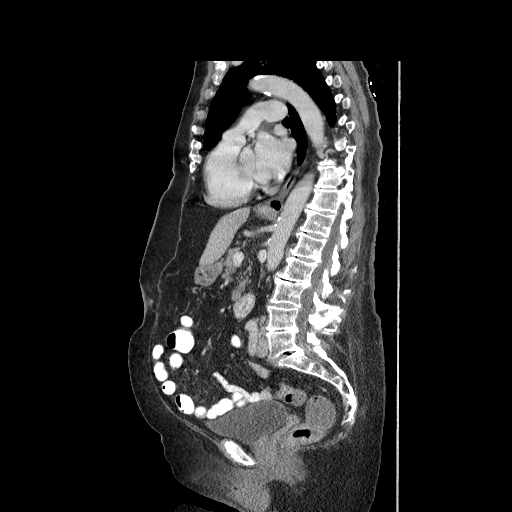
[im 97/153  soft-tissue]
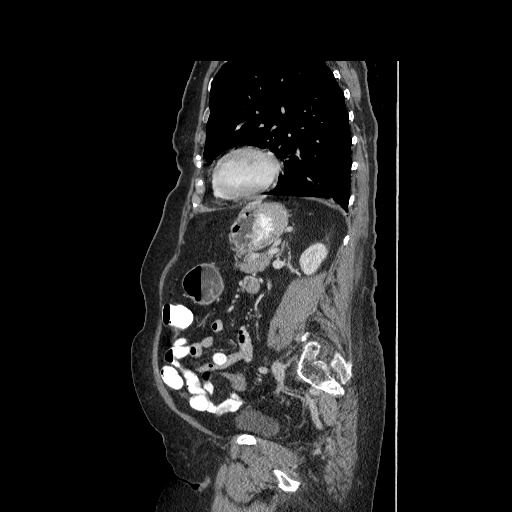
[im 111/153  soft-tissue]
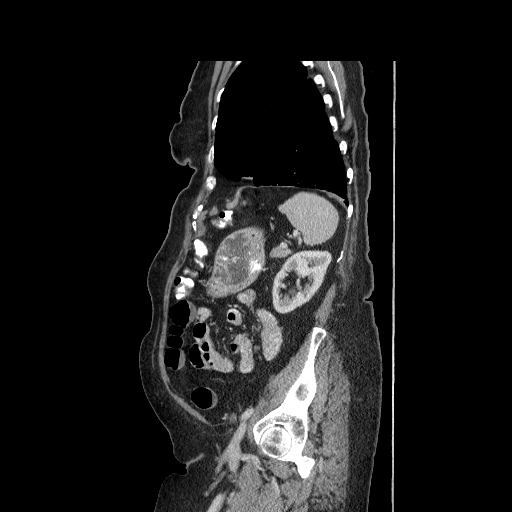
[im 125/153  soft-tissue]
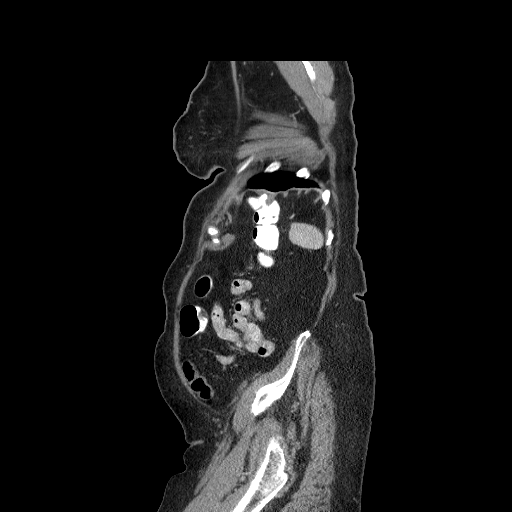
[im 125/153  bone]
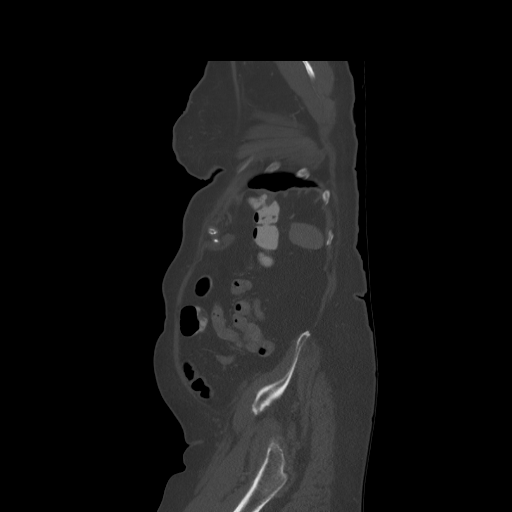
[im 139/153  soft-tissue]
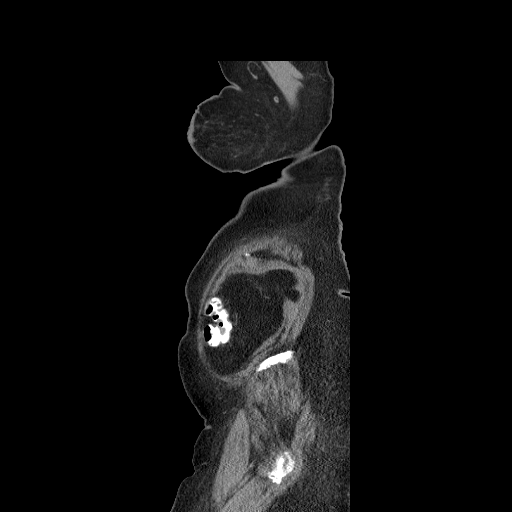

[11 of 36 positions shown; findings below may reference images not displayed]

FINDINGS: CT CHEST FINDINGS

Cardiovascular: Thoracic aortic atherosclerosis without aneurysm.
Coronary artery atherosclerosis. Normal heart size. No pericardial
effusion.

Mediastinum/Nodes: No enlarged axillary, mediastinal, or hilar lymph
nodes. Unremarkable thyroid and trachea. Small sliding hiatal
hernia.

Lungs/Pleura: No pleural effusion or pneumothorax. Subsegmental
atelectasis an scarring in the lung bases. Small cyst in the basilar
right lower lobe. No lung mass.

Musculoskeletal: 2.0 x 1.4 cm well-circumscribed fluid density focus
predominantly lateral to the right neural foramen at T8-9 is
unchanged and may reflect a perineural cyst or small lateral
meningocele. Mild thoracic disc degeneration.

CT ABDOMEN PELVIS FINDINGS

Hepatobiliary: 2 small low-density lesions in the left hepatic lobe
measure up to 1 cm in size, unchanged and too small to fully
characterize but likely benign cysts. Unremarkable gallbladder. No
biliary dilatation.

Pancreas: Unremarkable.

Spleen: Unremarkable.

Adrenals/Urinary Tract: Unremarkable adrenal glands. Two small
low-density left upper pole renal lesions, the larger measuring 9 mm
and most compatible with cysts. No evidence of renal calculi or
hydronephrosis. Unremarkable bladder.

Stomach/Bowel: Small sliding hiatal hernia, unchanged. Oral contrast
is present in nondilated loops of small and large bowel to the level
of the proximal descending colon without evidence of obstruction or
wall thickening. The appendix is not clearly identified, however no
inflammatory changes are seen in the right lower quadrant.

Vascular/Lymphatic: Abdominal aortic atherosclerosis without
aneurysm. No enlarged lymph nodes are identified.

Reproductive: Status post hysterectomy. No adnexal masses.

Other: No intraperitoneal free fluid. No abdominal wall mass or
hernia.

Musculoskeletal: Advanced multilevel lumbar disc and facet
degeneration. Grade 1 retrolisthesis of L1 on L2 and L2 on L3 and
grade 1 anterolisthesis of L5 on S1.
IMPRESSION: 1. No evidence of malignancy in the chest, abdomen, or pelvis.
2. Aortic atherosclerosis.
3. Small sliding hiatal hernia.

## 2017-12-16 ENCOUNTER — Telehealth: Payer: Self-pay

## 2017-12-16 NOTE — Telephone Encounter (Signed)
We have attempted to call the patient two times to schedule sleep study.  Patient has been unavailable at the phone numbers we have on file and has not returned our calls.  At this point we will send a letter asking patient to please contact the sleep lab to schedule their sleep study.  If patient calls back we will schedule them for their sleep study. 

## 2018-01-05 ENCOUNTER — Ambulatory Visit (INDEPENDENT_AMBULATORY_CARE_PROVIDER_SITE_OTHER): Payer: Medicare HMO | Admitting: Neurology

## 2018-01-05 DIAGNOSIS — F5102 Adjustment insomnia: Secondary | ICD-10-CM

## 2018-01-05 DIAGNOSIS — F11282 Opioid dependence with opioid-induced sleep disorder: Secondary | ICD-10-CM

## 2018-01-05 DIAGNOSIS — F1320 Sedative, hypnotic or anxiolytic dependence, uncomplicated: Secondary | ICD-10-CM

## 2018-01-05 DIAGNOSIS — F329 Major depressive disorder, single episode, unspecified: Secondary | ICD-10-CM

## 2018-01-05 DIAGNOSIS — G4721 Circadian rhythm sleep disorder, delayed sleep phase type: Secondary | ICD-10-CM

## 2018-01-05 DIAGNOSIS — G4733 Obstructive sleep apnea (adult) (pediatric): Secondary | ICD-10-CM

## 2018-01-05 DIAGNOSIS — F32A Depression, unspecified: Secondary | ICD-10-CM

## 2018-01-13 NOTE — Procedures (Signed)
NAME:   Heidi Fleming                                                               DOB: 03/02/1931 MEDICAL RECORD No:  [id number]                                             DOS: 01/06/2018  REFERRING PHYSICIAN: [name], MD STUDY PERFORMED: Home Sleep Study on watch pat HISTORY:  Heidi Fleming is an 84 y.o. female patient of Dr. Julianne Rice seen for transfer of Sleep Care on 11-18-2017. The patient used a CPAP machine since 2012- her first. The patient stated that Dr. Maudie Mercury had mentioned several times that he would like her to be re-evaluated for sleep apnea.  She gets frustrated, feeling that the machine kept her up at night rather than helping her to sleep better.  There have also been several other medical conditions that have developed in the interim.  The patient has hypertension, she denies depression but has anxiety, and she has chronic back, spinal stenosis, endometrial cancer, GERD, eczema, dyspnea with exertion, and for the last 6 months reported cardiac palpitation.  She has seen Dr. Einar Gip (cardiologist). BMI: 35.1. Epworth Sleepiness score endorsed at 3/ 24 points  STUDY RESULTS:  Total Recording Time: 8 hours 44 minutes, valid test time 7 h 2 min.  Total Apnea/Hypopnea Index (AHI): 29.8 /h, RDI:  30.1 /h Average Oxygen Saturation: 94 %; Lowest Oxygen Saturation: 89 %  Total Time in Oxygen Saturation below 89 %:  0.0 minutes  Average Heart Rate:  56 bpm (between 44 and 78 bpm) IMPRESSION: Moderate severe obstructive sleep apnea without associated hypoxemia. Delayed sleep time - recording begun after 1 AM.  RECOMMENDATION: CPAP therapy would still be treatment option number one- auto CPAP 5-15 cm water, 3 cm EPR with a mask of patient's choice. Heated humidity. If CPAP is something [name] cannot agree to, would consider a dental device as her apnea is not associated with hypoxemia. We can arrange for a sleep dentist referral.   I certify that I have reviewed the raw data recording prior to  the issuance of this report in accordance with the standards of the American Academy of Sleep Medicine (AASM). Larey Seat, M.D.    01-13-2018    Medical Director of [facility] accredited by the AASM. Diplomat of the ABPN and ABSM.

## 2018-01-13 NOTE — Addendum Note (Signed)
Addended by: Larey Seat on: 01/13/2018 05:17 PM   Modules accepted: Orders

## 2018-01-14 ENCOUNTER — Telehealth: Payer: Self-pay | Admitting: Neurology

## 2018-01-14 NOTE — Telephone Encounter (Signed)
-----   Message from Larey Seat, MD sent at 01/13/2018  5:17 PM EDT ----- Average Heart Rate: 56 bpm (between 44 and 78 bpm) IMPRESSION: Moderate severe obstructive sleep apnea without associated hypoxemia.Delayed sleep time - recording begun after 1 AM.   RECOMMENDATION: CPAP therapy would still be treatment option number one- auto CPAP 5-15 cm water, 3 cm EPR with a mask of patient's choice, and with heated humidity.   Plan B)  If CPAP is something Heidi Fleming cannot agree to, would consider a  dental device as her apnea is not associated with hypoxemia. We  can arrange for a sleep dentist referral.

## 2018-01-14 NOTE — Telephone Encounter (Signed)
Called the patient and reviewed the sleep study with her. Advised her that Dr Brett Fairy reviewed the sleep study and saw they patient has moderate to severe sleep apnea. Dr Dohmeier would recommend the patient would start auto CPAP. Pt states she currently has a CPAP but she had not been using it because she didn't like the way the mask fit her. Advised the patient the differences between the CPAP she has from 2012 and the one that Dr Brett Fairy is wanting to order for her. I also reviewed dental device as an option. Patient was established with [facility] pt. She states that she still gets calls to get her supplies. I informed her that medicare guidelines I would advise to verify they still accept her insurance. Patient would like to call me back with if she decides to continue using the old machine or go ahead and get a new machine since insurance will cover a new one. Patient was worried about the cost. I advised the patient that since she was not using it at the time of the recent visit and we dont know what the current machine is set on it would be hard to assess what pressure to set the old machine to.   Informed her of the set up of a new cpap machine process. Will wait to hear from the patient on what she would like to do to move forward.

## 2018-02-04 DIAGNOSIS — I10 Essential (primary) hypertension: Secondary | ICD-10-CM | POA: Diagnosis not present

## 2018-02-04 DIAGNOSIS — G4733 Obstructive sleep apnea (adult) (pediatric): Secondary | ICD-10-CM | POA: Diagnosis not present

## 2018-02-04 DIAGNOSIS — Z23 Encounter for immunization: Secondary | ICD-10-CM | POA: Diagnosis not present

## 2018-02-04 DIAGNOSIS — M4800 Spinal stenosis, site unspecified: Secondary | ICD-10-CM | POA: Diagnosis not present

## 2018-02-04 DIAGNOSIS — K219 Gastro-esophageal reflux disease without esophagitis: Secondary | ICD-10-CM | POA: Diagnosis not present

## 2018-02-04 DIAGNOSIS — R2681 Unsteadiness on feet: Secondary | ICD-10-CM | POA: Diagnosis not present

## 2018-05-27 ENCOUNTER — Telehealth: Payer: Self-pay | Admitting: Neurology

## 2018-05-27 ENCOUNTER — Other Ambulatory Visit: Payer: Self-pay | Admitting: Neurology

## 2018-05-27 NOTE — Telephone Encounter (Signed)
Pt is asking for a call from RN to discuss her suggested DME and getting CPAP supplies

## 2018-05-27 NOTE — Telephone Encounter (Signed)
Pt called the patient  I advised pt that Dr. Brett Fairy reviewed their sleep study results and found that pt has sleep apnea. Dr. Brett Fairy recommends that pt has sleep . I reviewed PAP compliance expectations with the pt. Pt is agreeable to starting a CPAP. I advised pt that an order will be sent to a DME, [facility], and [facility] will call the pt within about one week after they file with the pt's insurance. [facility] will show the pt how to use the machine, fit for masks, and troubleshoot the CPAP if needed. A follow up appt was made for insurance purposes with Janett Billow NP on April 2,2020 at 12:45 pm. Pt verbalized understanding to arrive 15 minutes early and bring their CPAP. A letter with all of this information in it will be mailed to the pt as a reminder. I verified with the pt that the address we have on file is correct. Pt verbalized understanding of results. Pt had no questions at this time but was encouraged to call back if questions arise. I have sent the order to [facility] and have received confirmation that they have received the order.

## 2018-06-03 ENCOUNTER — Encounter (INDEPENDENT_AMBULATORY_CARE_PROVIDER_SITE_OTHER): Payer: Medicare HMO | Admitting: Ophthalmology

## 2018-06-04 NOTE — Telephone Encounter (Signed)
Pt has called asking for a call from [place] about her discovering that [facility] is not covered in Network with her insurance.  Pt is asking for a call back to discuss.

## 2018-06-04 NOTE — Telephone Encounter (Signed)
Called the patient back and she advised that [facility] is not in network and [facility] is. I will send the orders for the patient there.

## 2018-06-10 NOTE — Telephone Encounter (Signed)
Pt called , [facility] contacted her a few days ago that they rec'd the order. She told them it was suppose to go to [facility] and to disregard. Can you check to make sure they have rec'd the order please and call her back.

## 2018-06-10 NOTE — Telephone Encounter (Signed)
PER previous conversation I did send the order over to [facility] patient and received confirmation from the fax. [facility] patient should have the order. Called the patient and made her aware of this as well as informed her I sent the order on 2/13. Advised the patient I will resend it to make sure and also gave the patient the number to [facility] so that she can reach out personally to them. Pt verbalized understanding.

## 2018-06-22 DIAGNOSIS — G4733 Obstructive sleep apnea (adult) (pediatric): Secondary | ICD-10-CM | POA: Diagnosis not present

## 2018-06-30 DIAGNOSIS — G4733 Obstructive sleep apnea (adult) (pediatric): Secondary | ICD-10-CM | POA: Diagnosis not present

## 2018-07-23 ENCOUNTER — Ambulatory Visit: Payer: Self-pay | Admitting: Adult Health

## 2018-07-23 DIAGNOSIS — G4733 Obstructive sleep apnea (adult) (pediatric): Secondary | ICD-10-CM | POA: Diagnosis not present

## 2018-08-10 DIAGNOSIS — G4733 Obstructive sleep apnea (adult) (pediatric): Secondary | ICD-10-CM | POA: Diagnosis not present

## 2018-08-25 ENCOUNTER — Encounter: Payer: Self-pay | Admitting: Neurology

## 2018-08-25 ENCOUNTER — Telehealth: Payer: Self-pay | Admitting: Neurology

## 2018-08-25 NOTE — Telephone Encounter (Signed)
Called the patient back. She started a new machine and from the set up date she has to be seen by 6/2. I have scheduled the apt as a Telephone visit with Ward Givens since the patient has to be seen for insurance purposes but doesn't have the capability for VIDEO visit.   Informed of what that process looks like and informed that the telephone visit will still be billed through insurance as such. Due to [place],informed the patient since the appointment is taking place over the phone/internet app, we can't guarantee the security of the phone line. With that said if we do move forward I would have to get verbal consent to complete the Video Visit/Phone call. Patient gave verbal consent to move forward with the video visit. I have reviewed the patient's chart and made sure that everything is up to date. Patient is also made aware that since this is a telephone visit we are able to complete the visit but a physical exam is not able to be done since the patient is not present in person. Pt informed that the front staff will contact the patient aprox 30 minutes prior to the scheduled appointment to "check them in" and make sure that everything is ready for the appointment to get started. Pt verbalized understanding of this information and will states to be ready for the visit at least 15-30 min prior to the visit.

## 2018-08-25 NOTE — Telephone Encounter (Signed)
Pt states she has been told she needs to be seen before 06-02.  Pt does not have an email and she does not have a cell phone.  Please call

## 2018-08-25 NOTE — Addendum Note (Signed)
Addended by: Darleen Crocker on: 08/25/2018 04:52 PM   Modules accepted: Orders

## 2018-09-16 ENCOUNTER — Encounter: Payer: Self-pay | Admitting: Neurology

## 2018-09-17 ENCOUNTER — Other Ambulatory Visit: Payer: Self-pay

## 2018-09-17 ENCOUNTER — Encounter: Payer: Self-pay | Admitting: Adult Health

## 2018-09-17 ENCOUNTER — Ambulatory Visit (INDEPENDENT_AMBULATORY_CARE_PROVIDER_SITE_OTHER): Payer: Medicare HMO | Admitting: Adult Health

## 2018-09-17 DIAGNOSIS — Z9989 Dependence on other enabling machines and devices: Secondary | ICD-10-CM | POA: Diagnosis not present

## 2018-09-17 DIAGNOSIS — G4733 Obstructive sleep apnea (adult) (pediatric): Secondary | ICD-10-CM | POA: Diagnosis not present

## 2018-09-17 NOTE — Progress Notes (Signed)
  [facility] [address] [place]. [place] [address] [phone]     Virtual Visit via Telephone Note  I connected with Heidi Fleming on 09/17/18 at  2:00 PM EDT by telephone located remotely at [facility] and verified that I am speaking with the correct person using two identifiers who reports being located at home.    Visit scheduled by RN. She discussed the limitations, risks, security and privacy concerns of performing an evaluation and management service by telephone and the availability of in person appointments. I also discussed with the patient that there may be a patient responsible charge related to this service. The patient expressed understanding and agreed to proceed. See telephone note for consent and additional scheduling information.    History of Present Illness:  Heidi Fleming is a 85 y.o. female who has been followed in this office for OSA on CPAP. She was initially scheduled for face-to-face office follow up visit today time but due to [place], visit rescheduled for non-face-to-face telephone visit with patients consent. Unable to participate in video visit due to lack of access to device with camera.    Heidi Fleming is an 85 year old female with a history of OSA on CPAP. She returns today for telephone visit her download indicates that she use her machine 25 out of 30 days for compliance of 83%.  She use her machine greater than 4 hours 24 days for compliance of 80%.  On average she uses her machine 5 hours and 43 minutes.  Her residual AHI is 0.6 on 5 to 15 cm of water with EPR of 3.  She does have a leak in the 95th percentile at 42.3 L/min.  She states that she has had several mask refitting.  She states that she likes her current mask the best.  She is clinic continue to try to improve her compliance and the leakage.     Observations/Objective:   Neurological examination  Mentation: Alert oriented to time, place, history  taking. speech and language fluent  Assessment and Plan:  1: OSA on CPAP  The patient's download shows good compliance and treatment of her apnea.  She is encouraged to continue using the CPAP nightly and greater than 4 hours each night.  Advised the patient if she feels her mask leaking we can consider another mask refitting she is also encouraged to make sure her straps are tight.  She voiced understanding.  She will follow-up in 6 months or sooner if needed.   Follow Up Instructions:    F/U 6 months    I discussed the assessment and treatment plan with the patient.  The patient was provided an opportunity to ask questions and all were answered to their satisfaction. The patient agreed with the plan and verbalized an understanding of the instructions.   I provided 15 minutes of non-face-to-face time during this encounter.         Ward Givens, MSN, NP-C 09/17/2018, 2:04 PM [facility] [address], [place],  [address] [phone]

## 2018-09-29 ENCOUNTER — Ambulatory Visit: Payer: Medicare HMO | Admitting: Family Medicine

## 2018-10-27 DIAGNOSIS — R69 Illness, unspecified: Secondary | ICD-10-CM | POA: Diagnosis not present

## 2019-02-09 DIAGNOSIS — I10 Essential (primary) hypertension: Secondary | ICD-10-CM | POA: Diagnosis not present

## 2019-02-09 DIAGNOSIS — Z79899 Other long term (current) drug therapy: Secondary | ICD-10-CM | POA: Diagnosis not present

## 2019-02-09 DIAGNOSIS — Z23 Encounter for immunization: Secondary | ICD-10-CM | POA: Diagnosis not present

## 2019-02-17 DIAGNOSIS — E559 Vitamin D deficiency, unspecified: Secondary | ICD-10-CM | POA: Diagnosis not present

## 2019-02-17 DIAGNOSIS — I10 Essential (primary) hypertension: Secondary | ICD-10-CM | POA: Diagnosis not present

## 2019-02-17 DIAGNOSIS — G4733 Obstructive sleep apnea (adult) (pediatric): Secondary | ICD-10-CM | POA: Diagnosis not present

## 2019-02-17 DIAGNOSIS — I340 Nonrheumatic mitral (valve) insufficiency: Secondary | ICD-10-CM | POA: Diagnosis not present

## 2019-02-17 DIAGNOSIS — K219 Gastro-esophageal reflux disease without esophagitis: Secondary | ICD-10-CM | POA: Diagnosis not present

## 2019-02-17 DIAGNOSIS — R69 Illness, unspecified: Secondary | ICD-10-CM | POA: Diagnosis not present

## 2019-02-17 DIAGNOSIS — I771 Stricture of artery: Secondary | ICD-10-CM | POA: Diagnosis not present

## 2019-03-23 DIAGNOSIS — G4733 Obstructive sleep apnea (adult) (pediatric): Secondary | ICD-10-CM | POA: Diagnosis not present

## 2019-03-24 ENCOUNTER — Ambulatory Visit: Payer: Medicare HMO | Admitting: Adult Health

## 2019-05-11 DIAGNOSIS — R69 Illness, unspecified: Secondary | ICD-10-CM | POA: Diagnosis not present

## 2019-05-11 DIAGNOSIS — R5383 Other fatigue: Secondary | ICD-10-CM | POA: Diagnosis not present

## 2019-05-11 DIAGNOSIS — D649 Anemia, unspecified: Secondary | ICD-10-CM | POA: Diagnosis not present

## 2019-05-11 DIAGNOSIS — Z9181 History of falling: Secondary | ICD-10-CM | POA: Diagnosis not present

## 2019-05-11 DIAGNOSIS — D509 Iron deficiency anemia, unspecified: Secondary | ICD-10-CM | POA: Diagnosis not present

## 2019-05-11 DIAGNOSIS — I10 Essential (primary) hypertension: Secondary | ICD-10-CM | POA: Diagnosis not present

## 2019-05-11 DIAGNOSIS — Z139 Encounter for screening, unspecified: Secondary | ICD-10-CM | POA: Diagnosis not present

## 2019-05-11 DIAGNOSIS — Z79899 Other long term (current) drug therapy: Secondary | ICD-10-CM | POA: Diagnosis not present

## 2019-05-11 DIAGNOSIS — E785 Hyperlipidemia, unspecified: Secondary | ICD-10-CM | POA: Diagnosis not present

## 2019-05-11 DIAGNOSIS — G4700 Insomnia, unspecified: Secondary | ICD-10-CM | POA: Diagnosis not present

## 2019-05-17 ENCOUNTER — Ambulatory Visit: Payer: Medicare HMO | Admitting: Family Medicine

## 2019-06-01 DIAGNOSIS — Z6838 Body mass index (BMI) 38.0-38.9, adult: Secondary | ICD-10-CM | POA: Diagnosis not present

## 2019-06-01 DIAGNOSIS — Z9989 Dependence on other enabling machines and devices: Secondary | ICD-10-CM | POA: Diagnosis not present

## 2019-06-01 DIAGNOSIS — G4700 Insomnia, unspecified: Secondary | ICD-10-CM | POA: Diagnosis not present

## 2019-06-01 DIAGNOSIS — E871 Hypo-osmolality and hyponatremia: Secondary | ICD-10-CM | POA: Diagnosis not present

## 2019-06-01 DIAGNOSIS — I10 Essential (primary) hypertension: Secondary | ICD-10-CM | POA: Diagnosis not present

## 2019-06-01 DIAGNOSIS — G4733 Obstructive sleep apnea (adult) (pediatric): Secondary | ICD-10-CM | POA: Diagnosis not present

## 2019-06-16 DIAGNOSIS — E871 Hypo-osmolality and hyponatremia: Secondary | ICD-10-CM | POA: Diagnosis not present

## 2019-07-07 DIAGNOSIS — E871 Hypo-osmolality and hyponatremia: Secondary | ICD-10-CM | POA: Diagnosis not present

## 2019-07-14 DIAGNOSIS — G4700 Insomnia, unspecified: Secondary | ICD-10-CM | POA: Diagnosis not present

## 2019-07-14 DIAGNOSIS — R69 Illness, unspecified: Secondary | ICD-10-CM | POA: Diagnosis not present

## 2019-07-14 DIAGNOSIS — Z6838 Body mass index (BMI) 38.0-38.9, adult: Secondary | ICD-10-CM | POA: Diagnosis not present

## 2019-07-14 DIAGNOSIS — R42 Dizziness and giddiness: Secondary | ICD-10-CM | POA: Diagnosis not present

## 2019-07-14 DIAGNOSIS — E871 Hypo-osmolality and hyponatremia: Secondary | ICD-10-CM | POA: Diagnosis not present

## 2019-07-14 DIAGNOSIS — I10 Essential (primary) hypertension: Secondary | ICD-10-CM | POA: Diagnosis not present

## 2019-08-11 DIAGNOSIS — Z79899 Other long term (current) drug therapy: Secondary | ICD-10-CM | POA: Diagnosis not present

## 2019-08-11 DIAGNOSIS — Z6838 Body mass index (BMI) 38.0-38.9, adult: Secondary | ICD-10-CM | POA: Diagnosis not present

## 2019-08-11 DIAGNOSIS — G4700 Insomnia, unspecified: Secondary | ICD-10-CM | POA: Diagnosis not present

## 2019-08-11 DIAGNOSIS — R600 Localized edema: Secondary | ICD-10-CM | POA: Diagnosis not present

## 2019-08-11 DIAGNOSIS — I10 Essential (primary) hypertension: Secondary | ICD-10-CM | POA: Diagnosis not present

## 2019-11-02 DIAGNOSIS — R69 Illness, unspecified: Secondary | ICD-10-CM | POA: Diagnosis not present

## 2019-11-11 DIAGNOSIS — R2681 Unsteadiness on feet: Secondary | ICD-10-CM | POA: Diagnosis not present

## 2019-11-11 DIAGNOSIS — G4700 Insomnia, unspecified: Secondary | ICD-10-CM | POA: Diagnosis not present

## 2019-11-11 DIAGNOSIS — I10 Essential (primary) hypertension: Secondary | ICD-10-CM | POA: Diagnosis not present

## 2019-11-11 DIAGNOSIS — R69 Illness, unspecified: Secondary | ICD-10-CM | POA: Diagnosis not present

## 2019-11-11 DIAGNOSIS — R600 Localized edema: Secondary | ICD-10-CM | POA: Diagnosis not present

## 2019-11-11 DIAGNOSIS — Z6838 Body mass index (BMI) 38.0-38.9, adult: Secondary | ICD-10-CM | POA: Diagnosis not present

## 2019-12-01 DIAGNOSIS — G4733 Obstructive sleep apnea (adult) (pediatric): Secondary | ICD-10-CM | POA: Diagnosis not present

## 2020-02-14 DIAGNOSIS — D509 Iron deficiency anemia, unspecified: Secondary | ICD-10-CM | POA: Diagnosis not present

## 2020-02-14 DIAGNOSIS — R0601 Orthopnea: Secondary | ICD-10-CM | POA: Diagnosis not present

## 2020-02-14 DIAGNOSIS — Z23 Encounter for immunization: Secondary | ICD-10-CM | POA: Diagnosis not present

## 2020-02-14 DIAGNOSIS — Z6838 Body mass index (BMI) 38.0-38.9, adult: Secondary | ICD-10-CM | POA: Diagnosis not present

## 2020-02-14 DIAGNOSIS — I10 Essential (primary) hypertension: Secondary | ICD-10-CM | POA: Diagnosis not present

## 2020-02-14 DIAGNOSIS — R69 Illness, unspecified: Secondary | ICD-10-CM | POA: Diagnosis not present

## 2020-02-14 DIAGNOSIS — Z79899 Other long term (current) drug therapy: Secondary | ICD-10-CM | POA: Diagnosis not present

## 2020-03-27 DIAGNOSIS — M2042 Other hammer toe(s) (acquired), left foot: Secondary | ICD-10-CM | POA: Diagnosis not present

## 2020-03-27 DIAGNOSIS — L600 Ingrowing nail: Secondary | ICD-10-CM | POA: Diagnosis not present

## 2020-03-27 DIAGNOSIS — M79674 Pain in right toe(s): Secondary | ICD-10-CM | POA: Diagnosis not present

## 2020-03-27 DIAGNOSIS — B351 Tinea unguium: Secondary | ICD-10-CM | POA: Diagnosis not present

## 2020-03-27 DIAGNOSIS — M79675 Pain in left toe(s): Secondary | ICD-10-CM | POA: Diagnosis not present

## 2020-03-27 DIAGNOSIS — M2011 Hallux valgus (acquired), right foot: Secondary | ICD-10-CM | POA: Diagnosis not present

## 2020-03-27 DIAGNOSIS — M2012 Hallux valgus (acquired), left foot: Secondary | ICD-10-CM | POA: Diagnosis not present

## 2020-03-27 DIAGNOSIS — M2041 Other hammer toe(s) (acquired), right foot: Secondary | ICD-10-CM | POA: Diagnosis not present

## 2020-05-30 DIAGNOSIS — Z6838 Body mass index (BMI) 38.0-38.9, adult: Secondary | ICD-10-CM | POA: Diagnosis not present

## 2020-05-30 DIAGNOSIS — R69 Illness, unspecified: Secondary | ICD-10-CM | POA: Diagnosis not present

## 2020-05-30 DIAGNOSIS — Z79899 Other long term (current) drug therapy: Secondary | ICD-10-CM | POA: Diagnosis not present

## 2020-05-30 DIAGNOSIS — Z139 Encounter for screening, unspecified: Secondary | ICD-10-CM | POA: Diagnosis not present

## 2020-05-30 DIAGNOSIS — I700 Atherosclerosis of aorta: Secondary | ICD-10-CM | POA: Diagnosis not present

## 2020-05-30 DIAGNOSIS — I10 Essential (primary) hypertension: Secondary | ICD-10-CM | POA: Diagnosis not present

## 2020-05-30 DIAGNOSIS — F1320 Sedative, hypnotic or anxiolytic dependence, uncomplicated: Secondary | ICD-10-CM | POA: Diagnosis not present

## 2020-05-30 DIAGNOSIS — G4700 Insomnia, unspecified: Secondary | ICD-10-CM | POA: Diagnosis not present

## 2020-05-30 DIAGNOSIS — R600 Localized edema: Secondary | ICD-10-CM | POA: Diagnosis not present

## 2020-05-30 DIAGNOSIS — Z9181 History of falling: Secondary | ICD-10-CM | POA: Diagnosis not present

## 2020-07-28 DIAGNOSIS — G4733 Obstructive sleep apnea (adult) (pediatric): Secondary | ICD-10-CM | POA: Diagnosis not present

## 2020-08-28 DIAGNOSIS — Z79899 Other long term (current) drug therapy: Secondary | ICD-10-CM | POA: Diagnosis not present

## 2020-08-28 DIAGNOSIS — Z6837 Body mass index (BMI) 37.0-37.9, adult: Secondary | ICD-10-CM | POA: Diagnosis not present

## 2020-08-28 DIAGNOSIS — R600 Localized edema: Secondary | ICD-10-CM | POA: Diagnosis not present

## 2020-08-28 DIAGNOSIS — R69 Illness, unspecified: Secondary | ICD-10-CM | POA: Diagnosis not present

## 2020-08-28 DIAGNOSIS — K219 Gastro-esophageal reflux disease without esophagitis: Secondary | ICD-10-CM | POA: Diagnosis not present

## 2020-08-28 DIAGNOSIS — R0600 Dyspnea, unspecified: Secondary | ICD-10-CM | POA: Diagnosis not present

## 2020-08-28 DIAGNOSIS — I10 Essential (primary) hypertension: Secondary | ICD-10-CM | POA: Diagnosis not present

## 2020-11-09 DIAGNOSIS — J90 Pleural effusion, not elsewhere classified: Secondary | ICD-10-CM | POA: Diagnosis not present

## 2020-11-09 DIAGNOSIS — Z6836 Body mass index (BMI) 36.0-36.9, adult: Secondary | ICD-10-CM | POA: Diagnosis not present

## 2020-11-09 DIAGNOSIS — R0600 Dyspnea, unspecified: Secondary | ICD-10-CM | POA: Diagnosis not present

## 2020-11-09 DIAGNOSIS — E871 Hypo-osmolality and hyponatremia: Secondary | ICD-10-CM | POA: Diagnosis not present

## 2020-11-09 DIAGNOSIS — R911 Solitary pulmonary nodule: Secondary | ICD-10-CM | POA: Diagnosis not present

## 2020-11-09 DIAGNOSIS — R69 Illness, unspecified: Secondary | ICD-10-CM | POA: Diagnosis not present

## 2020-11-09 DIAGNOSIS — R079 Chest pain, unspecified: Secondary | ICD-10-CM | POA: Diagnosis not present

## 2020-11-15 DIAGNOSIS — I700 Atherosclerosis of aorta: Secondary | ICD-10-CM | POA: Diagnosis not present

## 2020-11-15 DIAGNOSIS — J9811 Atelectasis: Secondary | ICD-10-CM | POA: Diagnosis not present

## 2020-11-15 DIAGNOSIS — J90 Pleural effusion, not elsewhere classified: Secondary | ICD-10-CM | POA: Diagnosis not present

## 2020-11-15 DIAGNOSIS — R911 Solitary pulmonary nodule: Secondary | ICD-10-CM | POA: Diagnosis not present

## 2020-11-15 DIAGNOSIS — I2510 Atherosclerotic heart disease of native coronary artery without angina pectoris: Secondary | ICD-10-CM | POA: Diagnosis not present

## 2020-11-15 DIAGNOSIS — R918 Other nonspecific abnormal finding of lung field: Secondary | ICD-10-CM | POA: Diagnosis not present

## 2020-11-16 ENCOUNTER — Telehealth: Payer: Self-pay | Admitting: Hematology and Oncology

## 2020-11-16 NOTE — Telephone Encounter (Signed)
Patient referred by Dr Daiva Eves for Lung Mass.  Appt made for 11/21/20 Labs 10:30 am - Consult 11:00 am

## 2020-11-21 ENCOUNTER — Ambulatory Visit: Payer: Medicare Other | Admitting: Hematology and Oncology

## 2020-11-21 ENCOUNTER — Encounter: Payer: Self-pay | Admitting: Hematology and Oncology

## 2020-11-21 ENCOUNTER — Other Ambulatory Visit: Payer: Self-pay | Admitting: Hematology and Oncology

## 2020-11-21 ENCOUNTER — Other Ambulatory Visit: Payer: Medicare Other

## 2020-11-21 ENCOUNTER — Inpatient Hospital Stay (INDEPENDENT_AMBULATORY_CARE_PROVIDER_SITE_OTHER): Payer: Medicare HMO | Admitting: Hematology and Oncology

## 2020-11-21 ENCOUNTER — Other Ambulatory Visit: Payer: Self-pay

## 2020-11-21 ENCOUNTER — Inpatient Hospital Stay: Payer: Medicare HMO | Attending: Hematology and Oncology

## 2020-11-21 VITALS — BP 215/94 | HR 98 | Temp 98.3°F | Resp 18 | Ht 60.0 in | Wt 170.7 lb

## 2020-11-21 DIAGNOSIS — R918 Other nonspecific abnormal finding of lung field: Secondary | ICD-10-CM

## 2020-11-21 DIAGNOSIS — C801 Malignant (primary) neoplasm, unspecified: Secondary | ICD-10-CM | POA: Diagnosis not present

## 2020-11-21 DIAGNOSIS — Z8542 Personal history of malignant neoplasm of other parts of uterus: Secondary | ICD-10-CM | POA: Diagnosis not present

## 2020-11-21 DIAGNOSIS — Z90710 Acquired absence of both cervix and uterus: Secondary | ICD-10-CM | POA: Insufficient documentation

## 2020-11-21 DIAGNOSIS — C7951 Secondary malignant neoplasm of bone: Secondary | ICD-10-CM | POA: Diagnosis not present

## 2020-11-21 DIAGNOSIS — D649 Anemia, unspecified: Secondary | ICD-10-CM | POA: Diagnosis not present

## 2020-11-21 DIAGNOSIS — Z79899 Other long term (current) drug therapy: Secondary | ICD-10-CM | POA: Insufficient documentation

## 2020-11-21 DIAGNOSIS — I10 Essential (primary) hypertension: Secondary | ICD-10-CM | POA: Insufficient documentation

## 2020-11-21 DIAGNOSIS — Z90722 Acquired absence of ovaries, bilateral: Secondary | ICD-10-CM | POA: Insufficient documentation

## 2020-11-21 DIAGNOSIS — M85859 Other specified disorders of bone density and structure, unspecified thigh: Secondary | ICD-10-CM | POA: Insufficient documentation

## 2020-11-21 DIAGNOSIS — J90 Pleural effusion, not elsewhere classified: Secondary | ICD-10-CM | POA: Insufficient documentation

## 2020-11-21 DIAGNOSIS — E785 Hyperlipidemia, unspecified: Secondary | ICD-10-CM | POA: Insufficient documentation

## 2020-11-21 DIAGNOSIS — G4730 Sleep apnea, unspecified: Secondary | ICD-10-CM | POA: Diagnosis not present

## 2020-11-21 DIAGNOSIS — Z87891 Personal history of nicotine dependence: Secondary | ICD-10-CM | POA: Insufficient documentation

## 2020-11-21 LAB — CBC AND DIFFERENTIAL
HCT: 40 (ref 36–46)
Hemoglobin: 13.5 (ref 12.0–16.0)
Neutrophils Absolute: 4.23
Platelets: 353 (ref 150–399)
WBC: 6.5

## 2020-11-21 LAB — BASIC METABOLIC PANEL
BUN: 12 (ref 4–21)
CO2: 26 — AB (ref 13–22)
Chloride: 93 — AB (ref 99–108)
Creatinine: 0.8 (ref 0.5–1.1)
Glucose: 96
Potassium: 3.9 (ref 3.4–5.3)
Sodium: 129 — AB (ref 137–147)

## 2020-11-21 LAB — COMPREHENSIVE METABOLIC PANEL
Albumin: 4.2 (ref 3.5–5.0)
Calcium: 8.6 — AB (ref 8.7–10.7)

## 2020-11-21 LAB — CBC
MCV: 79 — AB (ref 81–99)
RBC: 5.08 (ref 3.87–5.11)

## 2020-11-21 LAB — HEPATIC FUNCTION PANEL
ALT: 9 (ref 7–35)
AST: 26 (ref 13–35)
Alkaline Phosphatase: 120 (ref 25–125)
Bilirubin, Total: 0.6

## 2020-11-21 LAB — PROTIME-INR
INR: 0.9 (ref 0.8–1.2)
Prothrombin Time: 12.5 seconds (ref 11.4–15.2)

## 2020-11-21 NOTE — Progress Notes (Signed)
[facility]  [address] [place],  [place]  [address] [phone]  Clinic Day:  11/22/2020  Referring physician: [name], MD   CHIEF COMPLAINT:  CC: An 87 year old female with newly found left upper lobe pulmonary mass here for further evaluation  Current Treatment:  Diagnostics   HISTORY OF PRESENT ILLNESS:  Heidi Fleming is a 87 y.o. female with a history of new lung mass who is referred in consultation with Dr. Lisbeth Ply for assessment and management. Heidi Fleming's history, she states includes shortness of breath, which she has noted over the last year and which has worsened over the last several weeks. She continued to feel poorly with fatigue and weakness. She was seen by her primary care office who sent her for chest x-ray. This revealed multiple new left-sided pulmonary nodules and masses concerning for malignancy. CT was recommended. CT chest, abdomen and pelvis was then obtained which revealed left upper lobe pulmonary mass measuring up to 4.4 cm, with evidence of mediastinal invasion at the aortopulmonary window. There is abutment of the aortic arch and left main and upper lobe pulmonary arteries without vascular compression. Multiple pulmonary nodules in the left upper and lower lobes, consistent with metastases. Small left pleural effusion and adjacent atelectasis, concerning for malignant effusion. PET/CT is recommended.  Heidi Fleming's medical history is significant for anxiety, hyponatremia, sleep apnea, hypertension, GERD, bilateral leg edema, hyperlipidemia, thoracic aortic artherosclerosis, insomnia, iron deficiency anemia, osteopenia of the hip, nonrheumatic mitral valve insufficiency, spinal stenosis and hearing loss. She also has a personal history of endometrial cancer in 2013 which was treated with hysterectomy and radiation. She is up to date on mammogram and colonoscopy. She is due a bone scan. Family history is significant for a brother with lung  cancer and sister with melanoma.She lives alone, is a former smoker (smoked for one year; quit in 1957) denies alcohol use and is retired. Today she continues to have increased shortness of breath, weakness and fatigue. She denies fever, chills, nausea or vomiting. She denies issue with bowel or bladder. She denies cough or chest pain. CBC today is unremarkable other than a decreased MCV at 79. CMP reveals sodium 129.     REVIEW OF SYSTEMS:  Review of Systems  Constitutional:  Positive for fatigue. Negative for appetite change, chills, diaphoresis, fever and unexpected weight change.  HENT:   Negative for hearing loss, lump/mass, mouth sores, nosebleeds, sore throat, tinnitus, trouble swallowing and voice change.   Eyes:  Negative for eye problems and icterus.  Respiratory:  Positive for shortness of breath. Negative for chest tightness, cough, hemoptysis and wheezing.   Cardiovascular:  Negative for chest pain, leg swelling and palpitations.  Gastrointestinal:  Negative for abdominal distention, abdominal pain, blood in stool, constipation, diarrhea, nausea, rectal pain and vomiting.  Endocrine: Negative for hot flashes.  Genitourinary:  Negative for bladder incontinence, difficulty urinating, dyspareunia, dysuria, frequency, hematuria and nocturia.   Musculoskeletal:  Negative for arthralgias, back pain, flank pain, gait problem, myalgias, neck pain and neck stiffness.  Skin:  Negative for itching, rash and wound.  Neurological:  Positive for extremity weakness. Negative for dizziness, gait problem, headaches, light-headedness, numbness, seizures and speech difficulty.  Hematological:  Negative for adenopathy. Does not bruise/bleed easily.  Psychiatric/Behavioral:  Positive for sleep disturbance. Negative for confusion, decreased concentration, depression and suicidal ideas. The patient is nervous/anxious.     VITALS:  Blood pressure (!) 215/94, pulse 98, temperature 98.3 F (36.8 C),  temperature source  Oral, resp. rate 18, height 5' (1.524 m), weight 170 lb 11.2 oz (77.4 kg), SpO2 98 %.  Wt Readings from Last 3 Encounters:  11/21/20 170 lb 11.2 oz (77.4 kg)  11/18/17 179 lb (81.2 kg)  10/10/16 171 lb 3.2 oz (77.7 kg)    Body mass index is 33.34 kg/m.  Performance status (ECOG): 2 - Symptomatic, <50% confined to bed  PHYSICAL EXAM:  Physical Exam Constitutional:      General: She is not in acute distress.    Appearance: Normal appearance. She is normal weight. She is not ill-appearing, toxic-appearing or diaphoretic.  HENT:     Head: Normocephalic and atraumatic.     Nose: Nose normal. No congestion or rhinorrhea.     Mouth/Throat:     Mouth: Mucous membranes are moist.     Pharynx: Oropharynx is clear. No oropharyngeal exudate or posterior oropharyngeal erythema.  Eyes:     General: No scleral icterus.       Right eye: No discharge.        Left eye: No discharge.     Extraocular Movements: Extraocular movements intact.     Conjunctiva/sclera: Conjunctivae normal.     Pupils: Pupils are equal, round, and reactive to light.  Neck:     Vascular: No carotid bruit.  Cardiovascular:     Rate and Rhythm: Regular rhythm. Tachycardia present.     Heart sounds: No murmur heard.   No friction rub. No gallop.  Pulmonary:     Effort: Pulmonary effort is normal. No respiratory distress.     Breath sounds: Normal breath sounds. No stridor. No wheezing, rhonchi or rales.  Chest:     Chest wall: No tenderness.  Abdominal:     General: Abdomen is flat. Bowel sounds are normal. There is no distension.     Palpations: There is no mass.     Tenderness: no abdominal tenderness There is no right CVA tenderness, left CVA tenderness, guarding or rebound.     Hernia: No hernia is present.  Musculoskeletal:        General: No swelling, tenderness, deformity or signs of injury. Normal range of motion.     Cervical back: Normal range of motion and neck supple. No rigidity or  tenderness.     Right lower leg: No edema.     Left lower leg: No edema.  Lymphadenopathy:     Cervical: No cervical adenopathy.  Skin:    General: Skin is warm and dry.     Capillary Refill: Capillary refill takes less than 2 seconds.     Coloration: Skin is not jaundiced or pale.     Findings: No bruising, erythema, lesion or rash.  Neurological:     General: No focal deficit present.     Mental Status: She is alert and oriented to person, place, and time. Mental status is at baseline.     Cranial Nerves: No cranial nerve deficit.     Sensory: No sensory deficit.     Motor: No weakness.     Coordination: Coordination normal.     Gait: Gait normal.     Deep Tendon Reflexes: Reflexes normal.  Psychiatric:        Mood and Affect: Mood normal.        Behavior: Behavior normal.        Thought Content: Thought content normal.        Judgment: Judgment normal.   Lymph nodes:   There is no cervical, clavicular, axillary  or lymphadenopathy.  LABS:   CBC Latest Ref Rng & Units 11/21/2020 04/08/2012 04/03/2012  WBC - 6.5 13.3(H) 6.0  Hemoglobin 12.0 - 16.0 13.5 11.7(L) 13.5  Hematocrit 36 - 46 40 34.6(L) 39.6  Platelets 150 - 399 353 249 248   CMP Latest Ref Rng & Units 11/21/2020 04/09/2012 04/08/2012  Glucose 70 - 99 mg/dL - 109(H) 114(H)  BUN 4 - '21 12 11 12  '$ Creatinine 0.5 - 1.1 0.8 0.88 0.98  Sodium 137 - 147 129(A) 128(L) 128(L)  Potassium 3.4 - 5.3 3.9 3.6 3.5  Chloride 99 - 108 93(A) 95(L) 92(L)  CO2 13 - 22 26(A) 28 28  Calcium 8.7 - 10.7 8.6(A) 8.1(L) 8.5  Total Protein 6.0 - 8.3 g/dL - - -  Total Bilirubin 0.3 - 1.2 mg/dL - - -  Alkaline Phos 25 - 125 120 - -  AST 13 - 35 26 - -  ALT 7 - 35 9 - -     No results found for: CEA1 / No results found for: CEA1 No results found for: PSA1 No results found for: [id number] No results found for: [id number]  No results found for: TOTALPROTELP, ALBUMINELP, A1GS, A2GS, BETS, BETA2SER, GAMS, MSPIKE, SPEI No results found for: TIBC,  FERRITIN, IRONPCTSAT No results found for: LDH  STUDIES:   CLINICAL DATA:  10 pound weight loss over the past 6 months. Fatigue. History of endometrial cancer status post surgery and radiation.   Creatinine was obtained on site at [facility] at [address]Results: Creatinine 0.9 mg/dL.   EXAM: CT CHEST, ABDOMEN, AND PELVIS WITH CONTRAST   TECHNIQUE: Multidetector CT imaging of the chest, abdomen and pelvis was performed following the standard protocol during bolus administration of intravenous contrast.   CONTRAST:  163m ISOVUE-300 IOPAMIDOL (ISOVUE-300) INJECTION 61%   COMPARISON:  Chest CT 12/26/2010   FINDINGS: CT CHEST FINDINGS   Cardiovascular: Thoracic aortic atherosclerosis without aneurysm. Coronary artery atherosclerosis. Normal heart size. No pericardial effusion.   Mediastinum/Nodes: No enlarged axillary, mediastinal, or hilar lymph nodes. Unremarkable thyroid and trachea. Small sliding hiatal hernia.   Lungs/Pleura: No pleural effusion or pneumothorax. Subsegmental atelectasis an scarring in the lung bases. Small cyst in the basilar right lower lobe. No lung mass.   Musculoskeletal: 2.0 x 1.4 cm well-circumscribed fluid density focus predominantly lateral to the right neural foramen at T8-9 is unchanged and may reflect a perineural cyst or small lateral meningocele. Mild thoracic disc degeneration.   CT ABDOMEN PELVIS FINDINGS   Hepatobiliary: 2 small low-density lesions in the left hepatic lobe measure up to 1 cm in size, unchanged and too small to fully characterize but likely benign cysts. Unremarkable gallbladder. No biliary dilatation.   Pancreas: Unremarkable.   Spleen: Unremarkable.   Adrenals/Urinary Tract: Unremarkable adrenal glands. Two small low-density left upper pole renal lesions, the larger measuring 9 mm and most compatible with cysts. No evidence of renal calculi or hydronephrosis. Unremarkable bladder.    Stomach/Bowel: Small sliding hiatal hernia, unchanged. Oral contrast is present in nondilated loops of small and large bowel to the level of the proximal descending colon without evidence of obstruction or wall thickening. The appendix is not clearly identified, however no inflammatory changes are seen in the right lower quadrant.   Vascular/Lymphatic: Abdominal aortic atherosclerosis without aneurysm. No enlarged lymph nodes are identified.   Reproductive: Status post hysterectomy. No adnexal masses.   Other: No intraperitoneal free fluid. No abdominal wall mass or hernia.   Musculoskeletal: Advanced multilevel  lumbar disc and facet degeneration. Grade 1 retrolisthesis of L1 on L2 and L2 on L3 and grade 1 anterolisthesis of L5 on S1.   IMPRESSION: 1. No evidence of malignancy in the chest, abdomen, or pelvis. 2. Aortic atherosclerosis. 3. Small sliding hiatal hernia.     Electronically Signed   By: Logan Bores M.D.   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   DILATION AND CURETTAGE OF UTERUS     LYMPH NODE  DISSECTION  04/07/2012   Procedure: LYMPH NODE DISSECTION;  Surgeon: Imagene Gurney A. Alycia Rossetti, MD;  Location: [facility];  Service: Gynecology;  Laterality: Right;  pelvic   ROBOTIC ASSISTED TOTAL HYSTERECTOMY WITH BILATERAL SALPINGO OOPHERECTOMY  04/07/2012   Procedure: ROBOTIC ASSISTED TOTAL HYSTERECTOMY WITH BILATERAL SALPINGO OOPHORECTOMY;  Surgeon: Imagene Gurney A. Alycia Rossetti, MD;  Location: [facility];  Service: Gynecology;  Laterality: N/A;    Family History  Problem Relation Age of Onset   Diabetes Father    Heart disease Sister    Hypertension Sister    Heart disease Brother    Cancer Brother    Stroke Mother    Asthma Mother     Social History:  reports that she quit smoking about 63 years ago. Her smoking use included cigarettes. She has never used smokeless tobacco. She reports that she does not drink alcohol and does not use drugs.The patient is accompanied by sister-in-law today.  Allergies:  Allergies  Allergen Reactions   Latex Itching    Current Medications: Current Outpatient Medications  Medication Sig Dispense Refill   ALPRAZolam (XANAX) 0.5 MG tablet Take 0.5-1 mg by mouth at bedtime as needed. For sleep     amLODipine (NORVASC) 5 MG tablet      cholecalciferol (VITAMIN D) 1000 UNITS tablet Take 1,000 Units by mouth daily.     clotrimazole-betamethasone (LOTRISONE) cream Reported on 10/12/2015     hydrochlorothiazide (HYDRODIURIL) 25 MG tablet Take 12.5 mg by mouth every morning.      HYDROcodone-acetaminophen (VICODIN) 5-500 MG per tablet Take 1 tablet by mouth every 6 (six) hours as needed for pain. 12 tablet 0   ibuprofen (ADVIL,MOTRIN) 200 MG tablet Take 200 mg by mouth every 6 (six) hours as needed. For pain     omeprazole (PRILOSEC) 20 MG capsule Take 20 mg by mouth daily.     quinapril (ACCUPRIL) 20 MG tablet Take 20 mg by mouth every morning.      vitamin C (ASCORBIC ACID) 500 MG tablet Take 500 mg by mouth daily.  No current facility-administered medications for this visit.      ASSESSMENT & PLAN:   Assessment:  Heidi Fleming is a 87 y.o. female with newly found left upper lobe pulmonary mass measuring up to 4.4 cm concerning for malignancy. PET imaging is recommended for further evaluation. We discussed in detail the results of the CT scan and the concern for malignancy with the mass found in her left lung. We discussed the additional information a PET scan could provide including possible site for biopsy if the patient wishes to pursue that route. She is aware that a tissue biopsy is the only way to determine pathology of the mass. She is unsure as to whether she wants to pursue further diagnostics or treatment, but is interested in knowing the results of the PET scan before making her decision.   Plan: 1.  We will obtain authorization and order PET imaging. Once completed, she will return to clinic, we will review results and decide upon the next step in treatment planning.   I discussed the assessment and treatment plan with the patient.  The patient was provided an opportunity to ask questions and all were answered.  The patient agreed with the plan and demonstrated an understanding of the instructions.  The patient was advised to call back if the symptoms worsen or if the condition fails to improve as anticipated.  Thank you for the opportunity      Melodye Ped, NP

## 2020-11-28 ENCOUNTER — Telehealth: Payer: Self-pay | Admitting: Hematology and Oncology

## 2020-11-28 NOTE — Telephone Encounter (Signed)
11/28/20 spoke with patients sister and gave PET SCAN date and time.

## 2020-12-04 DIAGNOSIS — R7309 Other abnormal glucose: Secondary | ICD-10-CM | POA: Diagnosis not present

## 2020-12-04 DIAGNOSIS — R918 Other nonspecific abnormal finding of lung field: Secondary | ICD-10-CM | POA: Diagnosis not present

## 2020-12-04 DIAGNOSIS — C7951 Secondary malignant neoplasm of bone: Secondary | ICD-10-CM | POA: Diagnosis not present

## 2020-12-05 ENCOUNTER — Encounter: Payer: Self-pay | Admitting: Hematology and Oncology

## 2020-12-05 ENCOUNTER — Inpatient Hospital Stay (INDEPENDENT_AMBULATORY_CARE_PROVIDER_SITE_OTHER): Payer: Medicare HMO | Admitting: Hematology and Oncology

## 2020-12-05 ENCOUNTER — Other Ambulatory Visit: Payer: Self-pay

## 2020-12-05 ENCOUNTER — Telehealth: Payer: Self-pay | Admitting: Hematology and Oncology

## 2020-12-05 VITALS — BP 203/83 | HR 92 | Temp 97.9°F | Resp 18 | Ht 60.0 in | Wt 170.1 lb

## 2020-12-05 DIAGNOSIS — R918 Other nonspecific abnormal finding of lung field: Secondary | ICD-10-CM

## 2020-12-05 MED ORDER — ALPRAZOLAM 0.5 MG PO TABS: 0.5000 mg | ORAL_TABLET | Freq: Three times a day (TID) | ORAL | 0 refills | Status: DC | PRN

## 2020-12-05 MED ORDER — TRAMADOL HCL 50 MG PO TABS: 50.0000 mg | ORAL_TABLET | Freq: Four times a day (QID) | ORAL | 0 refills | Status: DC | PRN

## 2020-12-05 NOTE — Telephone Encounter (Signed)
Per 8/16 LOS, patient scheduled for 8/23 Telemedicine Visit w/Melissa at 2:30 pm

## 2020-12-05 NOTE — Progress Notes (Signed)
[facility]  [address] [place],  [place]  [address] [phone]  Clinic Day:  12/05/2020  Referring physician: Brantley Fling Me*   CHIEF COMPLAINT:  CC: An 87 year old female with newly found left upper lobe pulmonary mass here for further evaluation  Current Treatment:  Diagnostics   HISTORY OF PRESENT ILLNESS:  Heidi Fleming is a 87 y.o. female with a history of new lung mass who is referred in consultation with Dr. Lisbeth Fleming for assessment and management. Heidi Fleming's history, she states includes shortness of breath, which she has noted over the last year and which has worsened over the last several weeks. She continued to feel poorly with fatigue and weakness. She was seen by her primary care office who sent her for chest x-ray. This revealed multiple new left-sided pulmonary nodules and masses concerning for malignancy. CT was recommended. CT chest, abdomen and pelvis was then obtained which revealed left upper lobe pulmonary mass measuring up to 4.4 cm, with evidence of mediastinal invasion at the aortopulmonary window. There is abutment of the aortic arch and left main and upper lobe pulmonary arteries without vascular compression. Multiple pulmonary nodules in the left upper and lower lobes, consistent with metastases. Small left pleural effusion and adjacent atelectasis, concerning for malignant effusion. PET/CT is recommended.  Heidi Fleming's medical history is significant for anxiety, hyponatremia, sleep apnea, hypertension, GERD, bilateral leg edema, hyperlipidemia, thoracic aortic artherosclerosis, insomnia, iron deficiency anemia, osteopenia of the hip, nonrheumatic mitral valve insufficiency, spinal stenosis and hearing loss. She also has a personal history of endometrial cancer in 2013 which was treated with hysterectomy and radiation. She is up to date on mammogram and colonoscopy. She is due a bone scan. Family history is significant for a brother  with lung cancer and sister with melanoma.She lives alone, is a former smoker (smoked for one year; quit in 1957) denies alcohol use and is retired.   She presents to clinic today to review PET CT results. Her sister-in-law is present for today's visit. PET imaging reveals 1. Hypermetabolic LEFT upper lobe mass consistent primary bronchogenic carcinoma. 2. Multiple hypermetabolic nodules in the LEFT upper lobe and LEFT lower lobe consistent with metastatic pulmonary nodules. 3. Hypermetabolic deep pleural space metastasis along the LEFT hemidiaphragm. 4. Solitary skeletal metastasis to the T12 vertebral body. She had a fall recently, but denies any injury.  She continues to have left upper shoulder pain. She denies fever, chills, nausea or vomiting. She has occasional shortness of breath. She denies cough or chest pain. She denies issue with bowel or bladder. She is hypertensive today and states she is very anxious.      REVIEW OF SYSTEMS:  Review of Systems  Constitutional:  Positive for fatigue. Negative for appetite change, chills, diaphoresis, fever and unexpected weight change.  HENT:   Negative for hearing loss, lump/mass, mouth sores, nosebleeds, sore throat, tinnitus, trouble swallowing and voice change.   Eyes:  Negative for eye problems and icterus.  Respiratory:  Positive for shortness of breath. Negative for chest tightness, cough, hemoptysis and wheezing.   Cardiovascular:  Negative for chest pain, leg swelling and palpitations.  Gastrointestinal:  Negative for abdominal distention, abdominal pain, blood in stool, constipation, diarrhea, nausea, rectal pain and vomiting.  Endocrine: Negative for hot flashes.  Genitourinary:  Negative for bladder incontinence, difficulty urinating, dyspareunia, dysuria, frequency, hematuria and nocturia.   Musculoskeletal:  Negative for arthralgias, back pain, flank pain, gait problem, myalgias, neck pain and neck stiffness.  Skin:  Negative for  itching, rash and wound.  Neurological:  Positive for extremity weakness. Negative for dizziness, gait problem, headaches, light-headedness, numbness, seizures and speech difficulty.  Hematological:  Negative for adenopathy. Does not bruise/bleed easily.  Psychiatric/Behavioral:  Positive for sleep disturbance. Negative for confusion, decreased concentration, depression and suicidal ideas. The patient is nervous/anxious.     VITALS:  Blood pressure (!) 203/83, pulse 92, temperature 97.9 F (36.6 C), temperature source Oral, resp. rate 18, height 5' (1.524 m), weight 170 lb 1.6 oz (77.2 kg), SpO2 97 %.  Wt Readings from Last 3 Encounters:  12/05/20 170 lb 1.6 oz (77.2 kg)  11/21/20 170 lb 11.2 oz (77.4 kg)  11/18/17 179 lb (81.2 kg)    Body mass index is 33.22 kg/m.  Performance status (ECOG): 2 - Symptomatic, <50% confined to bed  PHYSICAL EXAM:  Physical Exam Constitutional:      General: She is not in acute distress.    Appearance: Normal appearance. She is normal weight. She is not ill-appearing, toxic-appearing or diaphoretic.  HENT:     Head: Normocephalic and atraumatic.     Nose: Nose normal. No congestion or rhinorrhea.     Mouth/Throat:     Mouth: Mucous membranes are moist.     Pharynx: Oropharynx is clear. No oropharyngeal exudate or posterior oropharyngeal erythema.  Eyes:     General: No scleral icterus.       Right eye: No discharge.        Left eye: No discharge.     Extraocular Movements: Extraocular movements intact.     Conjunctiva/sclera: Conjunctivae normal.     Pupils: Pupils are equal, round, and reactive to light.  Neck:     Vascular: No carotid bruit.  Cardiovascular:     Rate and Rhythm: Regular rhythm. Tachycardia present.     Heart sounds: No murmur heard.   No friction rub. No gallop.  Pulmonary:     Effort: Pulmonary effort is normal. No respiratory distress.     Breath sounds: Normal breath sounds. No stridor. No wheezing, rhonchi or rales.   Chest:     Chest wall: No tenderness.  Abdominal:     General: Abdomen is flat. Bowel sounds are normal. There is no distension.     Palpations: There is no mass.     Tenderness: There is no abdominal tenderness. There is no right CVA tenderness, left CVA tenderness, guarding or rebound.     Hernia: No hernia is present.  Musculoskeletal:        General: No swelling, tenderness, deformity or signs of injury. Normal range of motion.     Cervical back: Normal range of motion and neck supple. No rigidity or tenderness.     Right lower leg: No edema.     Left lower leg: No edema.  Lymphadenopathy:     Cervical: No cervical adenopathy.  Skin:    General: Skin is warm and dry.     Capillary Refill: Capillary refill takes less than 2 seconds.     Coloration: Skin is not jaundiced or pale.     Findings: No bruising, erythema, lesion or rash.  Neurological:     General: No focal deficit present.     Mental Status: She is alert and oriented to person, place, and time. Mental status is at baseline.     Cranial Nerves: No cranial nerve deficit.     Sensory: No sensory deficit.     Motor: No weakness.  Coordination: Coordination normal.     Gait: Gait normal.     Deep Tendon Reflexes: Reflexes normal.  Psychiatric:        Mood and Affect: Mood normal.        Behavior: Behavior normal.        Thought Content: Thought content normal.        Judgment: Judgment normal.   Lymph nodes:   There is no cervical, clavicular, axillary or lymphadenopathy.  LABS:   CBC Latest Ref Rng & Units 11/21/2020 04/08/2012 04/03/2012  WBC - 6.5 13.3(H) 6.0  Hemoglobin 12.0 - 16.0 13.5 11.7(L) 13.5  Hematocrit 36 - 46 40 34.6(L) 39.6  Platelets 150 - 399 353 249 248   CMP Latest Ref Rng & Units 11/21/2020 04/09/2012 04/08/2012  Glucose 70 - 99 mg/dL - 109(H) 114(H)  BUN 4 - '21 12 11 12  '$ Creatinine 0.5 - 1.1 0.8 0.88 0.98  Sodium 137 - 147 129(A) 128(L) 128(L)  Potassium 3.4 - 5.3 3.9 3.6 3.5  Chloride  99 - 108 93(A) 95(L) 92(L)  CO2 13 - 22 26(A) 28 28  Calcium 8.7 - 10.7 8.6(A) 8.1(L) 8.5  Total Protein 6.0 - 8.3 g/dL - - -  Total Bilirubin 0.3 - 1.2 mg/dL - - -  Alkaline Phos 25 - 125 120 - -  AST 13 - 35 26 - -  ALT 7 - 35 9 - -     No results found for: CEA1 / No results found for: CEA1 No results found for: PSA1 No results found for: [id number] No results found for: [id number]  No results found for: TOTALPROTELP, ALBUMINELP, A1GS, A2GS, BETS, BETA2SER, GAMS, MSPIKE, SPEI No results found for: TIBC, FERRITIN, IRONPCTSAT No results found for: LDH  STUDIES:  CLINICAL DATA:  Initial treatment strategy for lung mass.   EXAM:  NUCLEAR MEDICINE PET SKULL BASE TO THIGH   TECHNIQUE:  12.2 mCi F-18 FDG was injected intravenously. Full-ring PET imaging  was performed from the skull base to thigh after the radiotracer. CT  data was obtained and used for attenuation correction and anatomic  localization.   Fasting blood glucose: 105 mg/dl   COMPARISON:  Chest CT 11/15/2020   FINDINGS:  Mediastinal blood pool activity: SUV max 1.7   Liver activity: SUV max NA   NECK: No hypermetabolic lymph nodes in the neck.   Incidental CT findings: none   CHEST: Mass in the medial aspect of the LEFT upper lobe along the  mediastinal border measures 4.3 x 3.2 cm with intense metabolic  activity (SUV max equal 70.7.)   Multiple smaller hypermetabolic nodules in the LEFT upper lobe and  LEFT lower lobe.   Example:   Hypermetabolic nodule in the medial LEFT lower lobe along the  descending thoracic aorta measures 1.9 cm with SUV max equal 10.5.   Two adjacent nodules along the pleural surface of the posterolateral  LEFT lower lobe measure 1.7 cm each with intense metabolic activity  (SUV max equal 8.7.   Smaller hypermetabolic nodule in the lateral LEFT lung with SUV max  equal 4.4 on image 93.   Hypermetabolic LEFT upper lobe nodule measuring 6 mm with SUV max  equal 3.6 on image 74.    Hypermetabolic thickening along the medial LEFT hemidiaphragm with  SUV max equal 5.1 on image 124 is concerning for muscular  metastasis.   Incidental CT findings: none   ABDOMEN/PELVIS: Adrenal glands normal. No abnormal metabolic  activity liver.   No hypermetabolic  upper abdominal lymph nodes. No hypermetabolic  pelvic lymph nodes.   Incidental CT findings: none   SKELETON: Single focus of intense metabolic activity within the T12  vertebral body with SUV max equal 11.1 on image 126. There is a  subtle peripherally sclerotic lesion at this level.   Incidental CT findings: none   IMPRESSION:  1. Hypermetabolic LEFT upper lobe mass consistent primary  bronchogenic carcinoma.  2. Multiple hypermetabolic nodules in the LEFT upper lobe and LEFT  lower lobe consistent with metastatic pulmonary nodules.  3. Hypermetabolic deep pleural space metastasis along the LEFT  hemidiaphragm.  4. Solitary skeletal metastasis to the T12 vertebral body.    Electronically Signed    By: Suzy Bouchard M.D.    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   DILATION AND CURETTAGE OF UTERUS     LYMPH NODE DISSECTION  04/07/2012   Procedure: LYMPH NODE DISSECTION;  Surgeon: Imagene Gurney A. Alycia Rossetti, MD;  Location: [facility];  Service: Gynecology;  Laterality: Right;  pelvic   ROBOTIC ASSISTED TOTAL HYSTERECTOMY WITH BILATERAL SALPINGO OOPHERECTOMY  04/07/2012   Procedure: ROBOTIC ASSISTED TOTAL HYSTERECTOMY WITH BILATERAL SALPINGO OOPHORECTOMY;  Surgeon: Imagene Gurney A. Alycia Rossetti, MD;  Location: [facility];  Service: Gynecology;  Laterality: N/A;    Family History  Problem Relation Age of Onset   Diabetes Father    Heart disease Sister    Hypertension Sister    Heart disease Brother    Cancer Brother    Stroke Mother    Asthma Mother     Social History:  reports that she quit smoking about 63 years ago. Her smoking use included cigarettes. She has never used smokeless tobacco. She reports that she does not drink alcohol and does not use drugs.The patient is accompanied by sister-in-law today.  Allergies:  Allergies  Allergen Reactions   Latex Itching    Current Medications: Current Outpatient Medications  Medication Sig Dispense Refill   ALPRAZolam (XANAX) 0.5 MG tablet Take 1 tablet (0.5 mg total) by mouth 3 (three) times daily as needed for anxiety. 90 tablet 0   traMADol (ULTRAM) 50 MG tablet Take 1 tablet (50 mg total) by mouth every 6 (six) hours as needed. 30 tablet 0   ALPRAZolam (XANAX) 0.5 MG tablet Take 0.5-1 mg by mouth at bedtime as needed. For sleep     amLODipine (NORVASC) 5 MG tablet      cholecalciferol (VITAMIN D) 1000 UNITS tablet Take 1,000 Units by mouth daily.     clotrimazole-betamethasone (LOTRISONE)  cream Reported on 10/12/2015     hydrochlorothiazide (HYDRODIURIL) 25 MG tablet Take 12.5 mg by mouth every morning.      HYDROcodone-acetaminophen (VICODIN) 5-500 MG per tablet Take 1 tablet by mouth every 6 (six) hours as needed for pain. 12 tablet 0   ibuprofen (ADVIL,MOTRIN) 200 MG tablet Take 200 mg by  mouth every 6 (six) hours as needed. For pain     omeprazole (PRILOSEC) 20 MG capsule Take 20 mg by mouth daily.     quinapril (ACCUPRIL) 20 MG tablet Take 20 mg by mouth every morning.      vitamin C (ASCORBIC ACID) 500 MG tablet Take 500 mg by mouth daily.     No current facility-administered medications for this visit.     ASSESSMENT & PLAN:   Assessment:  JAYLEENE THORSON is a 87 y.o. female with most likely primary lung cancer. We reviewed the PET report and images for which she could see the multiple areas of concern in her chest and spine. We discussed that treatment options would depend upon a tissue diagnosis and could include chemotherapy and/or immunotherapy. She is asymptomatic from her spinal mets, but I explained that if she began to develop pain, we may consider radiation to that area. I did assure her that whatever her decision, we will offer support as needed. She is unsure at this time if she is willing to pursue further diagnostics. Her age is a concern for her as well as her ability to tolerate treatment. She would like some time to think about it before making a decision.  Plan: We will plan for a one week follow up via telemedicine to discuss the patient's decision. In the meantime, I will reach out to Interventional Radiology to evaluate images and give [place] their opinion on biopsy.   She verbalizes understanding of and agreement to the plans discussed today. She knows to call the office should any new questions or concerns arise.     Melodye Ped, NP

## 2020-12-12 ENCOUNTER — Ambulatory Visit (INDEPENDENT_AMBULATORY_CARE_PROVIDER_SITE_OTHER): Payer: Medicare HMO | Admitting: Hematology and Oncology

## 2020-12-12 DIAGNOSIS — R918 Other nonspecific abnormal finding of lung field: Secondary | ICD-10-CM | POA: Diagnosis not present

## 2020-12-12 NOTE — Progress Notes (Signed)
[facility]  [address] [place],    [id number] [phone]  Clinic Day:  12/12/2020  Referring physician: [facility]   I connected with  Heidi Fleming on 12/12/20 by a telemedicine application and verified that I am speaking with the correct person using two identifiers.   I discussed the limitations of evaluation and management by telemedicine. The patient expressed understanding and agreed to proceed.  Patient: Home Provider: Office Time on call: 15 minutes  CHIEF COMPLAINT:  CC: An 87 year old female with newly found left upper lobe pulmonary mass here for further evaluation  Current Treatment:  Diagnostics   HISTORY OF PRESENT ILLNESS:  Heidi Fleming is a 88 y.o. female with a history of new lung mass who is referred in consultation with Dr. Lisbeth Ply for assessment and management. Heidi Fleming's history, she states includes shortness of breath, which she has noted over the last year and which has worsened over the last several weeks. She continued to feel poorly with fatigue and weakness. She was seen by her primary care office who sent her for chest x-ray. This revealed multiple new left-sided pulmonary nodules and masses concerning for malignancy. CT was recommended. CT chest, abdomen and pelvis was then obtained which revealed left upper lobe pulmonary mass measuring up to 4.4 cm, with evidence of mediastinal invasion at the aortopulmonary window. There is abutment of the aortic arch and left main and upper lobe pulmonary arteries without vascular compression. Multiple pulmonary nodules in the left upper and lower lobes, consistent with metastases. Small left pleural effusion and adjacent atelectasis, concerning for malignant effusion. PET/CT is recommended.  Heidi Fleming's medical history is significant for anxiety, hyponatremia, sleep apnea, hypertension, GERD, bilateral leg edema, hyperlipidemia, thoracic aortic artherosclerosis,  insomnia, iron deficiency anemia, osteopenia of the hip, nonrheumatic mitral valve insufficiency, spinal stenosis and hearing loss. She also has a personal history of endometrial cancer in 2013 which was treated with hysterectomy and radiation. She is up to date on mammogram and colonoscopy. She is due a bone scan. Family history is significant for a brother with lung cancer and sister with melanoma.She lives alone, is a former smoker (smoked for one year; quit in 1957) denies alcohol use and is retired.   She presents to clinic today to review PET CT results. Her sister-in-law is present for today's visit. PET imaging reveals 1. Hypermetabolic LEFT upper lobe mass consistent primary bronchogenic carcinoma. 2. Multiple hypermetabolic nodules in the LEFT upper lobe and LEFT lower lobe consistent with metastatic pulmonary nodules. 3. Hypermetabolic deep pleural space metastasis along the LEFT hemidiaphragm. 4. Solitary skeletal metastasis to the T12 vertebral body. She had a fall recently, but denies any injury.    We reviewed information from last visit.   REVIEW OF SYSTEMS:  Review of Systems  Constitutional:  Positive for fatigue. Negative for appetite change, chills, diaphoresis, fever and unexpected weight change.  HENT:   Negative for hearing loss, lump/mass, mouth sores, nosebleeds, sore throat, tinnitus, trouble swallowing and voice change.   Eyes:  Negative for eye problems and icterus.  Respiratory:  Positive for shortness of breath. Negative for chest tightness, cough, hemoptysis and wheezing.   Cardiovascular:  Negative for chest pain, leg swelling and palpitations.  Gastrointestinal:  Negative for abdominal distention, abdominal pain, blood in stool, constipation, diarrhea, nausea, rectal pain and vomiting.  Endocrine: Negative for hot flashes.  Genitourinary:  Negative for bladder incontinence, difficulty urinating, dyspareunia, dysuria, frequency, hematuria and nocturia.  Musculoskeletal:  Negative for arthralgias, back pain, flank pain, gait problem, myalgias, neck pain and neck stiffness.  Skin:  Negative for itching, rash and wound.  Neurological:  Positive for extremity weakness. Negative for dizziness, gait problem, headaches, light-headedness, numbness, seizures and speech difficulty.  Hematological:  Negative for adenopathy. Does not bruise/bleed easily.  Psychiatric/Behavioral:  Positive for sleep disturbance. Negative for confusion, decreased concentration, depression and suicidal ideas. The patient is nervous/anxious.     VITALS:  There were no vitals taken for this visit.  Wt Readings from Last 3 Encounters:  12/05/20 170 lb 1.6 oz (77.2 kg)  11/21/20 170 lb 11.2 oz (77.4 kg)  11/18/17 179 lb (81.2 kg)    There is no height or weight on file to calculate BMI.  Performance status (ECOG): 2 - Symptomatic, <50% confined to bed  PHYSICAL EXAM:  Physical Exam Constitutional:      General: She is not in acute distress.    Appearance: Normal appearance. She is normal weight. She is not ill-appearing, toxic-appearing or diaphoretic.  HENT:     Head: Normocephalic and atraumatic.     Nose: Nose normal. No congestion or rhinorrhea.     Mouth/Throat:     Mouth: Mucous membranes are moist.     Pharynx: Oropharynx is clear. No oropharyngeal exudate or posterior oropharyngeal erythema.  Eyes:     General: No scleral icterus.       Right eye: No discharge.        Left eye: No discharge.     Extraocular Movements: Extraocular movements intact.     Conjunctiva/sclera: Conjunctivae normal.     Pupils: Pupils are equal, round, and reactive to light.  Neck:     Vascular: No carotid bruit.  Cardiovascular:     Rate and Rhythm: Regular rhythm. Tachycardia present.     Heart sounds: No murmur heard.   No friction rub. No gallop.  Pulmonary:     Effort: Pulmonary effort is normal. No respiratory distress.     Breath sounds: Normal breath sounds. No  stridor. No wheezing, rhonchi or rales.  Chest:     Chest wall: No tenderness.  Abdominal:     General: Abdomen is flat. Bowel sounds are normal. There is no distension.     Palpations: There is no mass.     Tenderness: There is no abdominal tenderness. There is no right CVA tenderness, left CVA tenderness, guarding or rebound.     Hernia: No hernia is present.  Musculoskeletal:        General: No swelling, tenderness, deformity or signs of injury. Normal range of motion.     Cervical back: Normal range of motion and neck supple. No rigidity or tenderness.     Right lower leg: No edema.     Left lower leg: No edema.  Lymphadenopathy:     Cervical: No cervical adenopathy.  Skin:    General: Skin is warm and dry.     Capillary Refill: Capillary refill takes less than 2 seconds.     Coloration: Skin is not jaundiced or pale.     Findings: No bruising, erythema, lesion or rash.  Neurological:     General: No focal deficit present.     Mental Status: She is alert and oriented to person, place, and time. Mental status is at baseline.     Cranial Nerves: No cranial nerve deficit.     Sensory: No sensory deficit.     Motor: No weakness.  Coordination: Coordination normal.     Gait: Gait normal.     Deep Tendon Reflexes: Reflexes normal.  Psychiatric:        Mood and Affect: Mood normal.        Behavior: Behavior normal.        Thought Content: Thought content normal.        Judgment: Judgment normal.   Lymph nodes:   There is no cervical, clavicular, axillary or lymphadenopathy.  LABS:   CBC Latest Ref Rng & Units 11/21/2020 04/08/2012 04/03/2012  WBC - 6.5 13.3(H) 6.0  Hemoglobin 12.0 - 16.0 13.5 11.7(L) 13.5  Hematocrit 36 - 46 40 34.6(L) 39.6  Platelets 150 - 399 353 249 248   CMP Latest Ref Rng & Units 11/21/2020 04/09/2012 04/08/2012  Glucose 70 - 99 mg/dL - 109(H) 114(H)  BUN 4 - '21 12 11 12  '$ Creatinine 0.5 - 1.1 0.8 0.88 0.98  Sodium 137 - 147 129(A) 128(L) 128(L)   Potassium 3.4 - 5.3 3.9 3.6 3.5  Chloride 99 - 108 93(A) 95(L) 92(L)  CO2 13 - 22 26(A) 28 28  Calcium 8.7 - 10.7 8.6(A) 8.1(L) 8.5  Total Protein 6.0 - 8.3 g/dL - - -  Total Bilirubin 0.3 - 1.2 mg/dL - - -  Alkaline Phos 25 - 125 120 - -  AST 13 - 35 26 - -  ALT 7 - 35 9 - -     No results found for: CEA1 / No results found for: CEA1 No results found for: PSA1 No results found for: [id number] No results found for: [id number]  No results found for: TOTALPROTELP, ALBUMINELP, A1GS, A2GS, BETS, BETA2SER, GAMS, MSPIKE, SPEI No results found for: TIBC, FERRITIN, IRONPCTSAT No results found for: LDH  STUDIES:  CLINICAL DATA:  Initial treatment strategy for lung mass.   EXAM:  NUCLEAR MEDICINE PET SKULL BASE TO THIGH   TECHNIQUE:  12.2 mCi F-18 FDG was injected intravenously. Full-ring PET imaging  was performed from the skull base to thigh after the radiotracer. CT  data was obtained and used for attenuation correction and anatomic  localization.   Fasting blood glucose: 105 mg/dl   COMPARISON:  Chest CT 11/15/2020   FINDINGS:  Mediastinal blood pool activity: SUV max 1.7   Liver activity: SUV max NA   NECK: No hypermetabolic lymph nodes in the neck.   Incidental CT findings: none   CHEST: Mass in the medial aspect of the LEFT upper lobe along the  mediastinal border measures 4.3 x 3.2 cm with intense metabolic  activity (SUV max equal 70.7.)   Multiple smaller hypermetabolic nodules in the LEFT upper lobe and  LEFT lower lobe.   Example:   Hypermetabolic nodule in the medial LEFT lower lobe along the  descending thoracic aorta measures 1.9 cm with SUV max equal 10.5.   Two adjacent nodules along the pleural surface of the posterolateral  LEFT lower lobe measure 1.7 cm each with intense metabolic activity  (SUV max equal 8.7.   Smaller hypermetabolic nodule in the lateral LEFT lung with SUV max  equal 4.4 on image 93.   Hypermetabolic LEFT upper lobe nodule  measuring 6 mm with SUV max  equal 3.6 on image 74.   Hypermetabolic thickening along the medial LEFT hemidiaphragm with  SUV max equal 5.1 on image 124 is concerning for muscular  metastasis.   Incidental CT findings: none   ABDOMEN/PELVIS: Adrenal glands normal. No abnormal metabolic  activity liver.   No hypermetabolic  upper abdominal lymph nodes. No hypermetabolic  pelvic lymph nodes.   Incidental CT findings: none   SKELETON: Single focus of intense metabolic activity within the T12  vertebral body with SUV max equal 11.1 on image 126. There is a  subtle peripherally sclerotic lesion at this level.   Incidental CT findings: none   IMPRESSION:  1. Hypermetabolic LEFT upper lobe mass consistent primary  bronchogenic carcinoma.  2. Multiple hypermetabolic nodules in the LEFT upper lobe and LEFT  lower lobe consistent with metastatic pulmonary nodules.  3. Hypermetabolic deep pleural space metastasis along the LEFT  hemidiaphragm.  4. Solitary skeletal metastasis to the T12 vertebral body.    Electronically Signed    By: Suzy Bouchard M.D.    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   DILATION AND CURETTAGE OF UTERUS     LYMPH NODE DISSECTION  04/07/2012   Procedure: LYMPH NODE DISSECTION;  Surgeon: Imagene Gurney A. Alycia Rossetti, MD;  Location: [facility];  Service: Gynecology;  Laterality: Right;  pelvic   ROBOTIC ASSISTED TOTAL HYSTERECTOMY WITH BILATERAL SALPINGO OOPHERECTOMY  04/07/2012   Procedure: ROBOTIC ASSISTED TOTAL HYSTERECTOMY WITH BILATERAL SALPINGO OOPHORECTOMY;  Surgeon: Imagene Gurney A. Alycia Rossetti, MD;  Location: [facility];  Service: Gynecology;  Laterality: N/A;    Family History  Problem Relation Age of Onset   Diabetes Father    Heart disease Sister    Hypertension Sister    Heart disease Brother    Cancer Brother    Stroke Mother    Asthma Mother     Social History:  reports that she quit smoking about 63 years ago. Her smoking use included cigarettes. She has never used smokeless tobacco. She reports that she does not drink alcohol and does not use drugs.The patient is accompanied by sister-in-law today.  Allergies:  Allergies  Allergen Reactions   Latex Itching    Current Medications: Current Outpatient Medications  Medication Sig Dispense Refill   ALPRAZolam (XANAX) 0.5 MG tablet Take 0.5-1 mg by mouth at bedtime as needed. For sleep     ALPRAZolam (XANAX) 0.5 MG tablet Take 1 tablet (0.5 mg total) by mouth 3 (three) times daily as needed for anxiety. 90 tablet 0   amLODipine (NORVASC) 5 MG tablet      cholecalciferol (VITAMIN D) 1000 UNITS tablet Take 1,000 Units by mouth daily.     clotrimazole-betamethasone (LOTRISONE) cream Reported on 10/12/2015     hydrochlorothiazide (HYDRODIURIL) 25 MG tablet Take 12.5 mg by mouth every morning.  HYDROcodone-acetaminophen (VICODIN) 5-500 MG per tablet Take 1 tablet by mouth every 6 (six) hours as needed for pain. 12 tablet 0   ibuprofen (ADVIL,MOTRIN) 200 MG tablet Take 200 mg by mouth every 6 (six) hours as needed. For pain     omeprazole  (PRILOSEC) 20 MG capsule Take 20 mg by mouth daily.     quinapril (ACCUPRIL) 20 MG tablet Take 20 mg by mouth every morning.      traMADol (ULTRAM) 50 MG tablet Take 1 tablet (50 mg total) by mouth every 6 (six) hours as needed. 30 tablet 0   vitamin C (ASCORBIC ACID) 500 MG tablet Take 500 mg by mouth daily.     No current facility-administered medications for this visit.     ASSESSMENT & PLAN:   Assessment:  Heidi Fleming is a 87 y.o. female with most likely primary lung cancer. We once again reviewed previous information and the patient has decided at this time, she does not want to proceed with biopsy. We discussed that we can provide the patient with symptom management as she needs it. We discussed palliative care agencies that may be able to offer assistance in the future. She declines referral at this time.  Plan: She will contact our office on an as needed basis and we will provide support as needed.   She verbalizes understanding of and agreement to the plans discussed today. She knows to call the office should any new questions or concerns arise.     Melodye Ped, NP

## 2021-01-17 ENCOUNTER — Encounter: Payer: Self-pay | Admitting: Hematology and Oncology

## 2021-01-17 ENCOUNTER — Ambulatory Visit (INDEPENDENT_AMBULATORY_CARE_PROVIDER_SITE_OTHER): Payer: Medicare HMO | Admitting: Hematology and Oncology

## 2021-01-17 DIAGNOSIS — R918 Other nonspecific abnormal finding of lung field: Secondary | ICD-10-CM

## 2021-01-17 DIAGNOSIS — R69 Illness, unspecified: Secondary | ICD-10-CM | POA: Diagnosis not present

## 2021-01-17 DIAGNOSIS — G4730 Sleep apnea, unspecified: Secondary | ICD-10-CM | POA: Diagnosis not present

## 2021-01-17 DIAGNOSIS — M5136 Other intervertebral disc degeneration, lumbar region: Secondary | ICD-10-CM | POA: Diagnosis not present

## 2021-01-17 DIAGNOSIS — F419 Anxiety disorder, unspecified: Secondary | ICD-10-CM

## 2021-01-17 NOTE — Assessment & Plan Note (Signed)
Known lung masses that are highly suspicious for malignancy. She opted not to move forward with biopsy as she did not want to pursue treatment if results were positive. She continues to question her decision and is more concerned with her quality of life. She is becoming weaker and wants to know if treatment would make her feel worse. We had a long discussion regarding side effects of chemotherapy/ immunotherapy in general; however, I did explain to her that it is difficult to know what her exact treatment would be as we do not have a tissue diagnosis. She continues to want to think about it. I will review her imaging again with radiology as to the ease of biopsy based upon location of tumor and patient's overall health. I will arrange for a telemedicine visit next week to allow the patient time to think about her decision. I reiterated that no matter her decision, we will provide symptom management as needed.

## 2021-01-17 NOTE — Assessment & Plan Note (Signed)
She continues to have chronic pain which is managed by Tramadol.

## 2021-01-17 NOTE — Assessment & Plan Note (Signed)
She continues to have anxiety. She is still questioning whether she would like to pursue diagnostics for lung mass. She is using xanax three times daily as needed.

## 2021-01-17 NOTE — Assessment & Plan Note (Signed)
She continues to have sleep apnea and has CPAP mask available; however, she does not wear it.

## 2021-01-17 NOTE — Progress Notes (Signed)
[facility]  [address] [place],  [place]  [address] [phone]  Clinic Day:  01/17/2021  Referring physician: [facility]*  I connected with  Heidi Fleming on 01/17/21 by telephone and verified that I am speaking with the correct person using two identifiers.   I discussed the limitations of evaluation and management by telemedicine and the availability of in person appointments. The patient expressed understanding and agreed to proceed.  Patient: Home Provider: Office Time spent on telephone visit: 15 minutes  ASSESSMENT & PLAN:   Assessment & Plan: Sleep apnea She continues to have sleep apnea and has CPAP mask available; however, she does not wear it.  DDD (degenerative disc disease), lumbar She continues to have chronic pain which is managed by Tramadol.   Anxiety She continues to have anxiety. She is still questioning whether she would like to pursue diagnostics for lung mass. She is using xanax three times daily as needed.  Lung mass Known lung masses that are highly suspicious for malignancy. She opted not to move forward with biopsy as she did not want to pursue treatment if results were positive. She continues to question her decision and is more concerned with her quality of life. She is becoming weaker and wants to know if treatment would make her feel worse. We had a long discussion regarding side effects of chemotherapy/ immunotherapy in general; however, I did explain to her that it is difficult to know what her exact treatment would be as we do not have a tissue diagnosis. She continues to want to think about it. I will review her imaging again with radiology as to the ease of biopsy based upon location of tumor and patient's overall health. I will arrange for a telemedicine visit next week to allow the patient time to think about her decision. I reiterated that no matter her decision, we will provide symptom management  as needed.    The patient understands the plans discussed today and is in agreement with them.  She knows to contact our office if she develops concerns prior to her next appointment.   I provided 15 minutes of face-to-face time during this encounter and > 50% was spent counseling as documented under my assessment and plan.    Melodye Ped, NP  [facility] [address] [place] [address] Dept: [phone] Dept Fax: [phone]   No orders of the defined types were placed in this encounter.     CHIEF COMPLAINT:  CC: An 87 year old female with history of lung masses for follow up discussion regarding diagnostic testing.  Current Treatment:  Observation   HISTORY OF PRESENT ILLNESS:   Oncology History  Endometrial cancer ([facility])  02/28/2012 Surgery   D&C for diagnosis of a grade I endometrial cancer   04/07/2012 Surgery   robotic hystercomy, BSO, right pelvic LND. IB (60%) grade I endometrial cacner, + LVSI    - 07/17/2012 Radiation Therapy   Completed vaginal brachytherapy      INTERVAL HISTORY:  Heidi Fleming is here today for follow up. Since her last visit, she continues to have generalized weakness and increasing back pain. She is managing her pain with Tramadol and only takes it at night. We discussed that she can have it every 6 hours as needed. She remains anxious about her decision to forgo treatment. She has many questions regarding the biopsy and possible treatment. She still wishes to  think it over. She denies fever, chills, nausea or vomiting. She denies shortness of breath, chest pain or cough. She denies issue with bowel or bladder. REVIEW OF SYSTEMS:  Review of Systems  Constitutional:  Positive for fatigue. Negative for appetite change, chills, diaphoresis, fever and unexpected weight change.  HENT:   Negative for hearing loss, lump/mass, mouth sores, nosebleeds, sore throat, tinnitus, trouble  swallowing and voice change.   Eyes:  Negative for eye problems and icterus.  Respiratory:  Negative for chest tightness, cough, hemoptysis, shortness of breath and wheezing.   Cardiovascular:  Negative for chest pain, leg swelling and palpitations.  Gastrointestinal:  Negative for abdominal distention, abdominal pain, blood in stool, constipation, diarrhea, nausea, rectal pain and vomiting.  Endocrine: Negative for hot flashes.  Genitourinary:  Negative for bladder incontinence, difficulty urinating, dyspareunia, dysuria, frequency, hematuria and nocturia.   Musculoskeletal:  Positive for arthralgias and back pain. Negative for flank pain, gait problem, myalgias, neck pain and neck stiffness.  Skin:  Negative for itching, rash and wound.  Neurological:  Negative for dizziness, extremity weakness, gait problem, headaches, light-headedness, numbness, seizures and speech difficulty.  Hematological:  Negative for adenopathy. Does not bruise/bleed easily.  Psychiatric/Behavioral:  Negative for confusion, decreased concentration, depression, sleep disturbance and suicidal ideas. The patient is nervous/anxious.     VITALS:  There were no vitals taken for this visit.  Wt Readings from Last 3 Encounters:  12/05/20 170 lb 1.6 oz (77.2 kg)  11/21/20 170 lb 11.2 oz (77.4 kg)  11/18/17 179 lb (81.2 kg)    There is no height or weight on file to calculate BMI.  Performance status (ECOG): 2 - Symptomatic, <50% confined to bed  PHYSICAL EXAM:  Physical Exam  LABS:   CBC Latest Ref Rng & Units 11/21/2020 04/08/2012 04/03/2012  WBC - 6.5 13.3(H) 6.0  Hemoglobin 12.0 - 16.0 13.5 11.7(L) 13.5  Hematocrit 36 - 46 40 34.6(L) 39.6  Platelets 150 - 399 353 249 248   CMP Latest Ref Rng & Units 11/21/2020 04/09/2012 04/08/2012  Glucose 70 - 99 mg/dL - 109(H) 114(H)  BUN 4 - 21 12 11 12   Creatinine 0.5 - 1.1 0.8 0.88 0.98  Sodium 137 - 147 129(A) 128(L) 128(L)  Potassium 3.4 - 5.3 3.9 3.6 3.5  Chloride 99  - 108 93(A) 95(L) 92(L)  CO2 13 - 22 26(A) 28 28  Calcium 8.7 - 10.7 8.6(A) 8.1(L) 8.5  Total Protein 6.0 - 8.3 g/dL - - -  Total Bilirubin 0.3 - 1.2 mg/dL - - -  Alkaline Phos 25 - 125 120 - -  AST 13 - 35 26 - -  ALT 7 - 35 9 - -     No results found for: CEA1 / No results found for: CEA1 No results found for: PSA1 No results found for: KNL976 No results found for: BHA193  No results found for: TOTALPROTELP, ALBUMINELP, A1GS, A2GS, BETS, BETA2SER, GAMS, MSPIKE, SPEI No results found for: TIBC, FERRITIN, IRONPCTSAT No results found for: LDH  STUDIES:  No results found.    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   DILATION AND CURETTAGE OF UTERUS     LYMPH NODE DISSECTION  04/07/2012   Procedure: LYMPH NODE DISSECTION;  Surgeon: Imagene Gurney A. Alycia Rossetti, MD;  Location: [facility];  Service: Gynecology;  Laterality: Right;  pelvic   ROBOTIC ASSISTED TOTAL HYSTERECTOMY WITH BILATERAL SALPINGO OOPHERECTOMY  04/07/2012   Procedure: ROBOTIC ASSISTED TOTAL HYSTERECTOMY WITH BILATERAL SALPINGO OOPHORECTOMY;  Surgeon: Imagene Gurney A. Alycia Rossetti, MD;  Location: [facility];   Service: Gynecology;  Laterality: N/A;    Family History  Problem Relation Age of Onset   Diabetes Father    Heart disease Sister    Hypertension Sister    Heart disease Brother    Cancer Brother    Stroke Mother    Asthma Mother     Social History:  reports that she quit smoking about 63 years ago. Her smoking use included cigarettes. She has never used smokeless tobacco. She reports that she does not drink alcohol and does not use drugs.The patient is alone  today.  Allergies:  Allergies  Allergen Reactions   Latex Itching    Current Medications: Current Outpatient Medications  Medication Sig Dispense Refill   ALPRAZolam (XANAX) 0.5 MG tablet Take 0.5-1 mg by mouth at bedtime as needed. For sleep     ALPRAZolam (XANAX) 0.5 MG tablet Take 1 tablet (0.5 mg total) by mouth 3 (three) times daily as needed for anxiety. 90 tablet 0   amLODipine (NORVASC) 5 MG tablet      cholecalciferol (VITAMIN D) 1000 UNITS tablet Take 1,000 Units by mouth daily.     clotrimazole-betamethasone (LOTRISONE) cream Reported on 10/12/2015     hydrochlorothiazide (HYDRODIURIL) 25 MG tablet Take 12.5 mg by mouth every morning.      HYDROcodone-acetaminophen (VICODIN) 5-500 MG per tablet Take 1 tablet by mouth every 6 (six) hours as needed for pain. 12 tablet 0   ibuprofen (ADVIL,MOTRIN) 200 MG tablet Take 200 mg by mouth every 6 (six) hours as needed. For pain     omeprazole (PRILOSEC) 20 MG capsule Take 20 mg by mouth daily.     quinapril (ACCUPRIL) 20 MG tablet Take 20 mg by mouth every morning.      traMADol (ULTRAM) 50 MG tablet Take 1 tablet (50 mg total) by mouth every 6 (six) hours as needed. 30 tablet 0   vitamin C (ASCORBIC ACID) 500 MG tablet Take 500 mg by mouth daily.     No current facility-administered medications for this visit.

## 2021-02-01 ENCOUNTER — Other Ambulatory Visit: Payer: Self-pay | Admitting: Hematology and Oncology

## 2021-02-01 DIAGNOSIS — F411 Generalized anxiety disorder: Secondary | ICD-10-CM

## 2021-02-06 DIAGNOSIS — Z6834 Body mass index (BMI) 34.0-34.9, adult: Secondary | ICD-10-CM | POA: Diagnosis not present

## 2021-02-06 DIAGNOSIS — R918 Other nonspecific abnormal finding of lung field: Secondary | ICD-10-CM | POA: Diagnosis not present

## 2021-02-06 DIAGNOSIS — E871 Hypo-osmolality and hyponatremia: Secondary | ICD-10-CM | POA: Diagnosis not present

## 2021-02-06 DIAGNOSIS — R69 Illness, unspecified: Secondary | ICD-10-CM | POA: Diagnosis not present

## 2021-02-06 DIAGNOSIS — I10 Essential (primary) hypertension: Secondary | ICD-10-CM | POA: Diagnosis not present

## 2021-03-07 DIAGNOSIS — M2011 Hallux valgus (acquired), right foot: Secondary | ICD-10-CM | POA: Diagnosis not present

## 2021-03-07 DIAGNOSIS — B351 Tinea unguium: Secondary | ICD-10-CM | POA: Diagnosis not present

## 2021-03-07 DIAGNOSIS — M2041 Other hammer toe(s) (acquired), right foot: Secondary | ICD-10-CM | POA: Diagnosis not present

## 2021-03-07 DIAGNOSIS — L600 Ingrowing nail: Secondary | ICD-10-CM | POA: Diagnosis not present

## 2021-03-07 DIAGNOSIS — M2042 Other hammer toe(s) (acquired), left foot: Secondary | ICD-10-CM | POA: Diagnosis not present

## 2021-03-07 DIAGNOSIS — M2012 Hallux valgus (acquired), left foot: Secondary | ICD-10-CM | POA: Diagnosis not present

## 2021-03-07 DIAGNOSIS — M79674 Pain in right toe(s): Secondary | ICD-10-CM | POA: Diagnosis not present

## 2021-03-07 DIAGNOSIS — M79675 Pain in left toe(s): Secondary | ICD-10-CM | POA: Diagnosis not present

## 2021-04-05 ENCOUNTER — Other Ambulatory Visit: Payer: Self-pay | Admitting: Hematology and Oncology

## 2021-04-05 DIAGNOSIS — F411 Generalized anxiety disorder: Secondary | ICD-10-CM

## 2021-04-18 ENCOUNTER — Other Ambulatory Visit: Payer: Self-pay

## 2021-04-18 ENCOUNTER — Telehealth: Payer: Self-pay

## 2021-04-18 DIAGNOSIS — E65 Localized adiposity: Secondary | ICD-10-CM | POA: Insufficient documentation

## 2021-04-18 DIAGNOSIS — R634 Abnormal weight loss: Secondary | ICD-10-CM | POA: Insufficient documentation

## 2021-04-18 DIAGNOSIS — M4800 Spinal stenosis, site unspecified: Secondary | ICD-10-CM | POA: Insufficient documentation

## 2021-04-18 DIAGNOSIS — R202 Paresthesia of skin: Secondary | ICD-10-CM | POA: Insufficient documentation

## 2021-04-18 DIAGNOSIS — R0600 Dyspnea, unspecified: Secondary | ICD-10-CM | POA: Insufficient documentation

## 2021-04-18 DIAGNOSIS — E559 Vitamin D deficiency, unspecified: Secondary | ICD-10-CM | POA: Insufficient documentation

## 2021-04-18 DIAGNOSIS — M25551 Pain in right hip: Secondary | ICD-10-CM | POA: Insufficient documentation

## 2021-04-18 DIAGNOSIS — B359 Dermatophytosis, unspecified: Secondary | ICD-10-CM | POA: Insufficient documentation

## 2021-04-18 DIAGNOSIS — I340 Nonrheumatic mitral (valve) insufficiency: Secondary | ICD-10-CM | POA: Insufficient documentation

## 2021-04-18 DIAGNOSIS — I771 Stricture of artery: Secondary | ICD-10-CM | POA: Insufficient documentation

## 2021-04-18 DIAGNOSIS — R269 Unspecified abnormalities of gait and mobility: Secondary | ICD-10-CM | POA: Insufficient documentation

## 2021-04-18 DIAGNOSIS — M9390 Osteochondropathy, unspecified of unspecified site: Secondary | ICD-10-CM | POA: Insufficient documentation

## 2021-04-18 DIAGNOSIS — K219 Gastro-esophageal reflux disease without esophagitis: Secondary | ICD-10-CM | POA: Insufficient documentation

## 2021-04-18 DIAGNOSIS — N939 Abnormal uterine and vaginal bleeding, unspecified: Secondary | ICD-10-CM | POA: Insufficient documentation

## 2021-04-18 DIAGNOSIS — R200 Anesthesia of skin: Secondary | ICD-10-CM | POA: Insufficient documentation

## 2021-04-18 DIAGNOSIS — Z0000 Encounter for general adult medical examination without abnormal findings: Secondary | ICD-10-CM | POA: Insufficient documentation

## 2021-04-18 DIAGNOSIS — M545 Low back pain, unspecified: Secondary | ICD-10-CM | POA: Insufficient documentation

## 2021-04-18 DIAGNOSIS — E7800 Pure hypercholesterolemia, unspecified: Secondary | ICD-10-CM | POA: Insufficient documentation

## 2021-04-18 DIAGNOSIS — F329 Major depressive disorder, single episode, unspecified: Secondary | ICD-10-CM | POA: Insufficient documentation

## 2021-04-18 NOTE — Telephone Encounter (Addendum)
Pt ret my call, telephone visit scheduled for 04/24/2021. ----- Message from Melodye Ped, NP sent at 04/18/2021 12:58 PM EST ----- Regarding: RE: Telephone appointment Let's schedule her for a telemed visit next week sometime. She does not want to pursue treatment at this time, but isn't really Hospice appropriate.  ----- Message ----- From: Dairl Ponder, RN Sent: 04/18/2021   9:40 AM EST To: Melodye Ped, NP Subject: Telephone appointment                          Pt called and LVM requesting a telephone appointment with you. She states she has several questions for you. I called her back to see if I could help her with any questions. She mentioned she would like to pursue getting someone to come in and help her with light housework. She said her sister called Hospice & they told her they couldn't do anything without an order. I told pt that Hospice doesn't offer those kind of services to my knowledge. She still had other questions for you. Would you like an appt scheduled so you get credit for the call, or do you want to call her? Maybe she needs a home health social work referral?  [phone].

## 2021-04-18 NOTE — Telephone Encounter (Signed)
Pt called and LVM requesting a telephone appointment with you. She states she has several questions for you. I called her back to see if I could help her with any questions. She mentioned she would like to pursue getting someone to come in and help her with light housework. She said her sister called Hospice & they told her they couldn't do anything without an order. I told pt that Hospice doesn't offer those kind of services to my knowledge. She still had other questions for you. Would you like an appt scheduled so you get credit for the call, or do you want to call her? Maybe she needs a home health social work referral?  [phone].  I sent the above message to [facility]

## 2021-04-24 ENCOUNTER — Encounter: Payer: Self-pay | Admitting: Hematology and Oncology

## 2021-04-24 ENCOUNTER — Ambulatory Visit (INDEPENDENT_AMBULATORY_CARE_PROVIDER_SITE_OTHER): Payer: Medicare HMO | Admitting: Hematology and Oncology

## 2021-04-24 DIAGNOSIS — M5136 Other intervertebral disc degeneration, lumbar region: Secondary | ICD-10-CM

## 2021-04-24 DIAGNOSIS — G4733 Obstructive sleep apnea (adult) (pediatric): Secondary | ICD-10-CM | POA: Diagnosis not present

## 2021-04-24 DIAGNOSIS — R918 Other nonspecific abnormal finding of lung field: Secondary | ICD-10-CM

## 2021-04-24 NOTE — Assessment & Plan Note (Addendum)
Known lung masses that are highly suspicious for malignancy. She opted not to move forward with biopsy as she did not want to pursue treatment if results were positive. She continues to question her decision and is more concerned with her quality of life. She is becoming weaker and wants to know if treatment would make her feel worse. We had a long discussion regarding side effects of chemotherapy/ immunotherapy in general; however, I did explain to her that it is difficult to know what her exact treatment would be as we do not have a tissue diagnosis. She continues to decline treatment. She is asking for resources to help her with some activities in the home. She is asking about Hospice. I did discuss that I do not feel like she is Hospice appropriate at this time; however, I will reach out to a couple of agencies to see if she would qualify for some assistance.

## 2021-04-24 NOTE — Assessment & Plan Note (Signed)
She continues to have sleep apnea and has CPAP mask available; however, she does not wear it.

## 2021-04-24 NOTE — Progress Notes (Addendum)
Patient Care Team: [facility], [place] as PCP - General (Internal Medicine) Melodye Ped, NP as Nurse Practitioner (Hematology and Oncology)  Clinic Day:  04/24/2021  Referring physician: [facility]*  I connected with  Heidi Fleming on 04/24/21 by telephone and verified that I am speaking with the correct person using two identifiers.   I discussed the limitations of evaluation and management by telemedicine. The patient expressed understanding and agreed to proceed.   Patient location: Home Provider location: Home/office Time spent on call: 30 minutes ASSESSMENT & PLAN:   Assessment & Plan: Obstructive sleep apnea syndrome She continues to have sleep apnea and has CPAP mask available; however, she does not wear it.  DDD (degenerative disc disease), lumbar She continues to have chronic pain which is managed by Tramadol.   Lung mass Known lung masses that are highly suspicious for malignancy. She opted not to move forward with biopsy as she did not want to pursue treatment if results were positive. She continues to question her decision and is more concerned with her quality of life. She is becoming weaker and wants to know if treatment would make her feel worse. We had a long discussion regarding side effects of chemotherapy/ immunotherapy in general; however, I did explain to her that it is difficult to know what her exact treatment would be as we do not have a tissue diagnosis. She continues to decline treatment. She is asking for resources to help her with some activities in the home. She is asking about Hospice. I did discuss that I do not feel like she is Hospice appropriate at this time; however, I will reach out to a couple of agencies to see if she would qualify for some assistance.    The patient understands the plans discussed today and is in agreement with them.  She knows to contact our office if she develops concerns prior to her next  appointment.    Melodye Ped, NP  [facility] [address] [place] [address] Dept: [phone] Dept Fax: [phone]   No orders of the defined types were placed in this encounter.     CHIEF COMPLAINT:  CC: An 88 year old female with history of lung mass for 3 month evaluation  Current Treatment:  Observation  INTERVAL HISTORY:  Heidi Fleming is here today for repeat clinical assessment. She denies fevers or chills. She denies pain. Her appetite is good. Her weight has been stable.  I have reviewed the past medical history, past surgical history, social history and family history with the patient and they are unchanged from previous note.  ALLERGIES:  is allergic to latex.  MEDICATIONS:  Current Outpatient Medications  Medication Sig Dispense Refill   ALPRAZolam (XANAX) 0.5 MG tablet Take 0.5-1 mg by mouth at bedtime as needed. For sleep     ALPRAZolam (XANAX) 0.5 MG tablet TAKE 1 TABLET (0.5 MG TOTAL) BY MOUTH 3 (THREE) TIMES DAILY AS NEEDED FOR ANXIETY. 90 tablet 0   amLODipine (NORVASC) 2.5 MG tablet Take 2.5 mg by mouth daily.     amLODipine (NORVASC) 5 MG tablet      cholecalciferol (VITAMIN D) 1000 UNITS tablet Take 1,000 Units by mouth daily.     clotrimazole-betamethasone (LOTRISONE) cream Reported on 10/12/2015     hydrochlorothiazide (HYDRODIURIL) 25 MG tablet Take 12.5 mg by mouth every morning.      HYDROcodone-acetaminophen (VICODIN) 5-500 MG per tablet Take 1 tablet by  mouth every 6 (six) hours as needed for pain. 12 tablet 0   ibuprofen (ADVIL,MOTRIN) 200 MG tablet Take 200 mg by mouth every 6 (six) hours as needed. For pain     omeprazole (PRILOSEC) 20 MG capsule Take 20 mg by mouth daily.     quinapril (ACCUPRIL) 20 MG tablet Take 20 mg by mouth every morning.      traMADol (ULTRAM) 50 MG tablet Take 1 tablet (50 mg total) by mouth every 6 (six) hours as needed. 30 tablet 0   vitamin C  (ASCORBIC ACID) 500 MG tablet Take 500 mg by mouth daily.     No current facility-administered medications for this visit.    HISTORY OF PRESENT ILLNESS:   Oncology History  Endometrial cancer ([facility])  02/28/2012 Surgery   D&C for diagnosis of a grade I endometrial cancer   04/07/2012 Surgery   robotic hystercomy, BSO, right pelvic LND. IB (60%) grade I endometrial cacner, + LVSI    - 07/17/2012 Radiation Therapy   Completed vaginal brachytherapy       REVIEW OF SYSTEMS:   Constitutional: Denies fevers, chills or abnormal weight loss Eyes: Denies blurriness of vision Ears, nose, mouth, throat, and face: Denies mucositis or sore throat Respiratory: Denies cough, dyspnea or wheezes Cardiovascular: Denies palpitation, chest discomfort or lower extremity swelling Gastrointestinal:  Denies nausea, heartburn or change in bowel habits Skin: Denies abnormal skin rashes Lymphatics: Denies new lymphadenopathy or easy bruising Neurological:Denies numbness, tingling or new weaknesses Behavioral/Psych: Mood is stable, no new changes  All other systems were reviewed with the patient and are negative.   VITALS:  There were no vitals taken for this visit.  Wt Readings from Last 3 Encounters:  12/05/20 170 lb 1.6 oz (77.2 kg)  11/21/20 170 lb 11.2 oz (77.4 kg)  11/18/17 179 lb (81.2 kg)    There is no height or weight on file to calculate BMI.  Performance status (ECOG): 1 - Symptomatic but completely ambulatory    LABORATORY DATA:  I have reviewed the data as listed    Component Value Date/Time   NA 129 (A) 11/21/2020 0000   K 3.9 11/21/2020 0000   CL 93 (A) 11/21/2020 0000   CO2 26 (A) 11/21/2020 0000   GLUCOSE 109 (H) 04/09/2012 0435   BUN 12 11/21/2020 0000   CREATININE 0.8 11/21/2020 0000   CREATININE 0.88 04/09/2012 0435   CALCIUM 8.6 (A) 11/21/2020 0000   PROT 7.1 04/03/2012 1220   ALBUMIN 4.2 11/21/2020 0000   AST 26 11/21/2020 0000   ALT 9 11/21/2020 0000    ALKPHOS 120 11/21/2020 0000   BILITOT 0.6 04/03/2012 1220   GFRNONAA 61 (L) 04/09/2012 0435   GFRAA 71 (L) 04/09/2012 0435    No results found for: SPEP, UPEP  Lab Results  Component Value Date   WBC 6.5 11/21/2020   NEUTROABS 4.23 11/21/2020   HGB 13.5 11/21/2020   HCT 40 11/21/2020   MCV 79 (A) 11/21/2020   PLT 353 11/21/2020      Chemistry      Component Value Date/Time   NA 129 (A) 11/21/2020 0000   K 3.9 11/21/2020 0000   CL 93 (A) 11/21/2020 0000   CO2 26 (A) 11/21/2020 0000   BUN 12 11/21/2020 0000   CREATININE 0.8 11/21/2020 0000   CREATININE 0.88 04/09/2012 0435   GLU 96 11/21/2020 0000      Component Value Date/Time   CALCIUM 8.6 (A) 11/21/2020 0000   ALKPHOS 120  11/21/2020 0000   AST 26 11/21/2020 0000   ALT 9 11/21/2020 0000   BILITOT 0.6 04/03/2012 1220       RADIOGRAPHIC STUDIES: I have personally reviewed the radiological images as listed and agreed with the findings in the report. No results found.

## 2021-04-24 NOTE — Assessment & Plan Note (Signed)
She continues to have chronic pain which is managed by Tramadol.

## 2021-05-08 ENCOUNTER — Other Ambulatory Visit: Payer: Self-pay

## 2021-05-08 DIAGNOSIS — M5136 Other intervertebral disc degeneration, lumbar region: Secondary | ICD-10-CM

## 2021-05-08 MED ORDER — TRAMADOL HCL 50 MG PO TABS: 50.0000 mg | ORAL_TABLET | Freq: Four times a day (QID) | ORAL | 0 refills | Status: DC | PRN

## 2021-05-09 DIAGNOSIS — K5900 Constipation, unspecified: Secondary | ICD-10-CM | POA: Diagnosis not present

## 2021-05-09 DIAGNOSIS — R69 Illness, unspecified: Secondary | ICD-10-CM | POA: Diagnosis not present

## 2021-05-09 DIAGNOSIS — Z6831 Body mass index (BMI) 31.0-31.9, adult: Secondary | ICD-10-CM | POA: Diagnosis not present

## 2021-05-09 DIAGNOSIS — R918 Other nonspecific abnormal finding of lung field: Secondary | ICD-10-CM | POA: Diagnosis not present

## 2021-05-09 DIAGNOSIS — M533 Sacrococcygeal disorders, not elsewhere classified: Secondary | ICD-10-CM | POA: Diagnosis not present

## 2021-05-10 DIAGNOSIS — M533 Sacrococcygeal disorders, not elsewhere classified: Secondary | ICD-10-CM | POA: Diagnosis not present

## 2021-05-10 DIAGNOSIS — M8580 Other specified disorders of bone density and structure, unspecified site: Secondary | ICD-10-CM | POA: Diagnosis not present

## 2021-05-10 DIAGNOSIS — M8588 Other specified disorders of bone density and structure, other site: Secondary | ICD-10-CM | POA: Diagnosis not present

## 2021-05-10 DIAGNOSIS — M5136 Other intervertebral disc degeneration, lumbar region: Secondary | ICD-10-CM | POA: Diagnosis not present

## 2021-07-06 DIAGNOSIS — E860 Dehydration: Secondary | ICD-10-CM | POA: Diagnosis not present

## 2021-08-20 DEATH — deceased
